# Patient Record
Sex: Male | Born: 1950
Health system: Southern US, Community
[De-identification: ages and names within clinical notes are randomized; demographics above are authoritative.]

## PROBLEM LIST (undated history)

## (undated) DIAGNOSIS — H269 Unspecified cataract: Secondary | ICD-10-CM

## (undated) DIAGNOSIS — R972 Elevated prostate specific antigen [PSA]: Secondary | ICD-10-CM

## (undated) DIAGNOSIS — I10 Essential (primary) hypertension: Secondary | ICD-10-CM

## (undated) DIAGNOSIS — E785 Hyperlipidemia, unspecified: Secondary | ICD-10-CM

## (undated) HISTORY — DX: Elevated prostate specific antigen (PSA): R97.20

## (undated) HISTORY — PX: PROSTATE BIOPSY: SHX241

## (undated) HISTORY — DX: Hyperlipidemia, unspecified: E78.5

## (undated) HISTORY — PX: EYE SURGERY: SHX253

## (undated) HISTORY — DX: Unspecified cataract: H26.9

## (undated) HISTORY — PX: CARDIAC CATHETERIZATION: SHX172

## (undated) HISTORY — PX: NOSE SURGERY: SHX723

---
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
## 2007-04-29 ENCOUNTER — Inpatient Hospital Stay (HOSPITAL_COMMUNITY): Admission: EM | Admit: 2007-04-29 | Discharge: 2007-05-01 | Payer: Self-pay | Admitting: Emergency Medicine

## 2010-09-15 NOTE — Cardiovascular Report (Signed)
NAME:  Beard, Ryan               ACCOUNT NO.:  [id number]   MEDICAL RECORD NO.:  [id number]          PATIENT TYPE:  INP   LOCATION:  3710                         FACILITY:  [facility]   PHYSICIAN:  Nicki Guadalajara, M.D.     DATE OF BIRTH:  [birth year]   DATE OF PROCEDURE:  DATE OF DISCHARGE:                            CARDIAC CATHETERIZATION   [name] is a 60 year old patient who was admitted on Saturday,  April 29, 2007 with chest pain.  He had recently seen Dr. Allyson Sabal and  had a mildly abnormal stress Myoview scan done for evaluation of chest  pain.  He had been scheduled for an outpatient cardiac catheterization  tentatively for May 09, 2007.  He has a history of hyperlipidemia,  remote tobacco use.  With his recent admission with chest pain  definitive cardiac catheterization was recommended now while in the  hospital.   PROCEDURE:  After premedication with Versed 2 mg intravenously, the  patient was prepped and draped in usual fashion.  His right femoral  artery was punctured anteriorly and a 5-French sheath was inserted  without difficulty.  Diagnostic cardiac catheterization was done with 5-  [place] Judkins for left coronary catheter.  For selective angiography in  the right coronary artery a 5-French 3.5 right catheter was necessary  due to the upward takeoff of the vessel.  A 5-French pigtail catheter  was used for biplane selective angiography, as well as distal  aortography.  Hemostasis was obtained by direct manual pressure.  The  patient tolerated the procedure well and returned to his room in  satisfactory condition.   HEMODYNAMIC DATA:  Central aortic pressure is 107/61.  Left ventricular  pressure is 107/90.   ANGIOGRAPHIC DATA:  The left main coronary artery was angiographically  normal and bifurcated into an LAD and left circumflex system.   The LAD gave rise to a proximal diagonal vessel, as well as a smaller  mid second diagonal vessel.  There was  mild to moderate luminal  irregularity of 10-20% narrowings in the first diagonal vessel, as well  as in the LAD beyond the first diagonal vessel.   The circumflex vessel was angiographically normal and gave rise to two  marginal vessels.   The right coronary artery was a large-caliber dominant vessel that had  luminal irregularity of approximately 20% in the proximal mid segment  and also in the region of the crux.  The RCA supplied a large PDA and  PLA vessel.   Biplane selective artery revealed normal LV contractility without focal  segmental wall motion abnormalities.  There was suggestion of mild  angiographic mitral valve prolapse.   Distal aortography did not demonstrate any renal artery stenosis.  There  was no significant aneurysmal dilatation.  He had a normal aortoiliac  system.   IMPRESSION:  1. Normal left ventricular function with suggestion of mild      angiographic mitral valve prolapse.  2. Mild luminal irregularity without significant obstructive stenoses      with 10-20% narrowings involving the proximal to mid left anterior      descending artery,  first diagonal branch of the left anterior      descending artery and right coronary artery.   RECOMMENDATIONS:  Medical therapy.  The patient will be discharged home  later today with plans for follow up with Dr. Allyson Sabal.           ______________________________  Nicki Guadalajara, M.D.     TK/MEDQ  D:  05/01/2007  T:  05/01/2007  Job:  [id number]   cc:   Antonieta Iba, MD  Nanetta Batty, M.D.  Fayrene Fearing Little

## 2010-09-15 NOTE — H&P (Signed)
NAME:  Munos, Hardie               ACCOUNT NO.:  [id number]   MEDICAL RECORD NO.:  [id number]          PATIENT TYPE:  INP   LOCATION:  1832                         FACILITY:  [facility]   PHYSICIAN:  Antonieta Iba, MD   DATE OF BIRTH:  07-Jun-1950   DATE OF ADMISSION:  04/29/2007  DATE OF DISCHARGE:                              HISTORY & PHYSICAL   PRIORITY ADMISSION HISTORY AND PHYSICAL   Mr. Marotto, a 60 year old, Caucasian gentleman, presents to the  emergency room at [facility] with the complaint of chest pain.  He was a new patient in our practice recently seen by Dr. Allyson Sabal and  underwent stress test that revealed abnormal results.  The patient was  referred to our office by Dr. Susann Givens, who noticed that he started  experiencing some chest pain.  He could not exactly describe it, but  said that it starts with specific activity, but he has had it mainly  when he becomes emotionally upset and he experiences tightness in the  head and knows that his blood pressure goes up and at the same time has  the tightness and pain in the mid chest.  At the same time, he has  shortness of breath, but never experiencing any diaphoresis, nausea, or  vomiting.  With these symptoms, he was seen in our office and, as I  mentioned above, underwent a stress test, which revealed abnormal  results.  Hence, he was scheduled for diagnostic coronary angiography on  May 09, 2007, but today called [place] and said that he started having  some more chest pain and he did not have any nitroglycerin prescribed to  him so patient was advised to come to the emergency room for evaluation.   PAST MEDICAL HISTORY:  1. Hypercholesterolemia.  2. Vitamin D deficiency.  3. Testosterone deficiency.   FAMILY HISTORY:  Significant for coronary artery disease.  His brother  and his father both were diagnosed with that.   SOCIAL HISTORY:  He is married, does not have children, he quit smoking  in July of 2008,  he is physically active and plays golf and said that  ironically his chest pain occurs while he plays golf, but never happens  when he exerts himself walking on a treadmill or doing some more  aggressive exercises.   He does not smoke, occasionally drinks alcohol, he works as an  Acupuncturist, and he has his own business.   ALLERGIES:  No known drug allergies.   REVIEW OF SYSTEMS:  As mentioned, patient does complain of chest pain  that occurs sometimes at rest, more so lately, but mainly with emotional  exertion or sometimes with moderate physical activity.  He denies any  musculoskeletal complaints, he does have occasional indigestion  depending on what type of food he eats, he does not have any nausea,  vomiting, diarrhea, constipation, there is no melena, the patient denied  any hematuria or any dysuria, frequency, or urgency with urination.  There is no headache, night sweats, weight loss or gain recently.  All  other review of systems is negative.  MEDICATIONS:  1. Aspirin 81 mg daily.  2. Crestor 5 mg daily.  3. Vitamin D once a week, but he is unsure of the dose of it and his      wife will bring it later.   PHYSICAL EXAMINATION:  VITAL SIGNS:  Blood pressure 160/90, first  reading, and the second reading revealed higher systolic blood pressure  of 166, heart rate 75, his respirations were 20, temperature 98.1, and O  SATs 99% at room air.  HEENT:  Normocephalic, atraumatic, extraocular movements intact.  NECK:  Without any JVD or carotid bruits.  LUNGS:  Clear to auscultation and percussion bilaterally.  HEART:  Regular rate and rhythm, there are no murmurs, gallops, or rubs.  ABDOMEN:  Soft, nontender, and nondistended with positive bowel sounds  x4.  EXTREMITIES:  No signs of edema with intact peripheral pulses,  extremities were intact in pedal pulses.  NEURO:  Grossly intact, no focal abnormality, the patient was alert and  oriented x3.   His EKG showed  sinus rhythm, no acute ST-T wave changes.   LABORATORY DATA:  All are pending.   IMPRESSION:  1. Chest pain.  The patient had abnormal stress test in our office and      was scheduled for coronary angiography on May 09, 2007.  2. Known history of hyperlipidemia.  3. Vitamin D deficiency.  4. Testosterone deficiency.  5. Accelerated blood pressure on admission.   PLAN:  We are going to admit patient on rule out MI protocol, cycle his  enzymes, start him on IV heparin.  IV nitro will be started if patient  experiences another onset of chest pain.  We will also add beta blockers  that would help with anginal symptoms and better blood pressure control,  and continue home meds.  All further recommendations will be provided by  Dr. Mariah Milling.      Raymon Mutton, P.A.      Antonieta Iba, MD  Electronically Signed    MK/MEDQ  D:  04/29/2007  T:  04/29/2007  Job:  [id number]   cc:   [facility]  Nanetta Batty, M.D.

## 2010-09-15 NOTE — Discharge Summary (Signed)
NAME:  Ryan Beard, Ryan Beard               ACCOUNT NO.:  [id number]   MEDICAL RECORD NO.:  [id number]          PATIENT TYPE:  INP   LOCATION:  2899                         FACILITY:  [facility]   PHYSICIAN:  Antonieta Iba, MD   DATE OF BIRTH:  1950-11-30   DATE OF ADMISSION:  04/29/2007  DATE OF DISCHARGE:  05/01/2007                               DISCHARGE SUMMARY   DISCHARGE DIAGNOSES:  1. Chest pain worrisome for unstable angina with known abnormal      outpatient exercise Myoview, mild coronary disease at      catheterization this admission.  2. Dyslipidemia.   HOSPITAL COURSE:  The patient is a 60 year old male followed by Dr.  Aida Puffer.  He was referred to Dr. Mariah Milling on April 25, 2007.  He  had been having some chest pain.  Apparently an outpatient Myoview had  been done previously which showed ischemia in the basilar, anterior, and  anterolateral regions with good LV function.  The patient was to have a  diagnostic cath at [facility] but presented in the interim with  chest pain to [facility].  He was admitted, started on nitrates and heparin  and beta blocker.  MI was ruled out.  Diagnostic catheterization was  done May 01, 2007 which revealed a 20% RCA and 10% LAD with normal  LV function.  His Crestor was increased to 10 mg a day, and we feel he  can be discharged May 01, 2007.  He will follow up with Dr. Mariah Milling  and Dr. Aida Puffer, his primary care doctor.   DISCHARGE MEDICATIONS:  1. Crestor 10 mg a day.  2. Aspirin 81 mg a day.  3. Vitamin D.  4. CoQ10.   LABORATORY DATA:  White count 10.8, hemoglobin 13.1, hematocrit 38.6,  platelets 184.  Sodium 140, potassium 3.6, BUN 14, creatinine 0.8.  LFTs  were normal.  CK-MB and troponins were negative.  TSH is 1.13.  INR 0.9.   Chest x-ray shows no acute disease.   DISPOSITION:  The patient discharged in stable condition and will  followup with Dr. Dossie Arbour and Dr. Aida Puffer.      Abelino Derrick,  P.A.      Antonieta Iba, MD  Electronically Signed    LKK/MEDQ  D:  05/01/2007  T:  05/01/2007  Job:  [id number]   cc:   Aida Puffer

## 2011-02-05 LAB — CBC
HCT: 38.6 — ABNORMAL LOW
HCT: 40.7
HCT: 44.1
HCT: 44.5
Hemoglobin: 15.1
MCV: 87.5
MCV: 88.8
MCV: 88.9
Platelets: 164
Platelets: 184
Platelets: 188
Platelets: 205
RBC: 4.65
RBC: 5
RBC: 5
RDW: 13.2
RDW: 13.4
WBC: 10.8 — ABNORMAL HIGH
WBC: 6.9

## 2011-02-05 LAB — PROTIME-INR
INR: 0.9
INR: 1
Prothrombin Time: 12.9

## 2011-02-05 LAB — CARDIAC PANEL(CRET KIN+CKTOT+MB+TROPI)
CK, MB: 0.9
Total CK: 87
Troponin I: 0.02

## 2011-02-05 LAB — MAGNESIUM: Magnesium: 2.3

## 2011-02-05 LAB — COMPREHENSIVE METABOLIC PANEL
AST: 27
Alkaline Phosphatase: 40
BUN: 14
CO2: 22
Creatinine, Ser: 0.85
Glucose, Bld: 109 — ABNORMAL HIGH

## 2011-02-05 LAB — CK TOTAL AND CKMB (NOT AT ARMC)
CK, MB: 1.7
Total CK: 111

## 2011-02-05 LAB — BASIC METABOLIC PANEL
BUN: 14
Calcium: 9.1
Chloride: 106
Creatinine, Ser: 0.89
Creatinine, Ser: 0.94
GFR calc Af Amer: >60
GFR calc non Af Amer: >60
Glucose, Bld: 104 — ABNORMAL HIGH
Potassium: 3.6
Potassium: 4
Sodium: 140

## 2011-02-05 LAB — I-STAT 8, (EC8 V) (CONVERTED LAB)
Acid-base deficit: 1
HCT: 44
Hemoglobin: 15
Sodium: 138
TCO2: 24
pCO2, Ven: 34 — ABNORMAL LOW

## 2011-02-05 LAB — DIFFERENTIAL
Basophils Relative: 0
Eosinophils Absolute: 0.2
Eosinophils Relative: 3
Lymphs Abs: 1.9
Monocytes Relative: 8

## 2011-02-05 LAB — TSH: TSH: 1.133

## 2011-02-05 LAB — POCT CARDIAC MARKERS
Myoglobin, poc: 74.6
Operator id: 265201
Troponin i, poc: <0.05

## 2011-02-05 LAB — HEPARIN LEVEL (UNFRACTIONATED): Heparin Unfractionated: 0.52

## 2013-02-12 ENCOUNTER — Encounter: Payer: Self-pay | Admitting: Internal Medicine

## 2013-03-06 ENCOUNTER — Encounter: Payer: Self-pay | Admitting: Internal Medicine

## 2013-03-14 ENCOUNTER — Ambulatory Visit (AMBULATORY_SURGERY_CENTER): Payer: Self-pay | Admitting: *Deleted

## 2013-03-14 VITALS — Ht 69.0 in | Wt 186.6 lb

## 2013-03-14 DIAGNOSIS — Z1211 Encounter for screening for malignant neoplasm of colon: Secondary | ICD-10-CM

## 2013-03-14 MED ORDER — MOVIPREP 100 G PO SOLR: ORAL | Status: DC

## 2013-03-14 NOTE — Progress Notes (Signed)
No allergies to eggs or soy. No problems with anesthesia.  

## 2013-03-15 ENCOUNTER — Encounter: Payer: Self-pay | Admitting: Gastroenterology

## 2013-03-28 ENCOUNTER — Ambulatory Visit (AMBULATORY_SURGERY_CENTER): Payer: BC Managed Care – PPO | Admitting: Gastroenterology

## 2013-03-28 ENCOUNTER — Encounter: Payer: Self-pay | Admitting: Gastroenterology

## 2013-03-28 VITALS — BP 146/85 | HR 65 | Temp 97.1°F | Resp 17 | Ht 69.0 in | Wt 186.0 lb

## 2013-03-28 DIAGNOSIS — D126 Benign neoplasm of colon, unspecified: Secondary | ICD-10-CM

## 2013-03-28 DIAGNOSIS — Z1211 Encounter for screening for malignant neoplasm of colon: Secondary | ICD-10-CM

## 2013-03-28 MED ORDER — SODIUM CHLORIDE 0.9 % IV SOLN: 500.0000 mL | INTRAVENOUS | Status: DC

## 2013-03-28 NOTE — Op Note (Signed)
[facility] [address] [place], [address]   COLONOSCOPY PROCEDURE REPORT  PATIENT: Ryan Beard, Ryan Beard  MR#: [id number] BIRTHDATE: 03-30-1951 , 62  yrs. old GENDER: Male ENDOSCOPIST: Mardella Layman, MD, [facility] REFERRED BY: PROCEDURE DATE:  03/28/2013 PROCEDURE:   Colonoscopy with snare polypectomy First Screening Colonoscopy - Avg.  risk and is 50 yrs.  old or older - No.  Prior Negative Screening - Now for repeat screening. N/A  History of Adenoma - Now for follow-up colonoscopy & has been > or = to 3 yrs.  N/A  Polyps Removed Today? Yes. ASA CLASS: INDICATIONS:average risk screening. MEDICATIONS: propofol (Diprivan) 350mg  IV  DESCRIPTION OF PROCEDURE:   After the risks benefits and alternatives of the procedure were thoroughly explained, informed consent was obtained.  A digital rectal exam revealed no abnormalities of the rectum.   The LB [id number]  endoscope was introduced through the anus and advanced to the cecum, which was identified by both the appendix and ileocecal valve. No adverse events experienced.   Limited by a tortuous and redundant colon. The quality of the prep was adequate.  The instrument was then slowly withdrawn as the colon was fully examined.      COLON FINDINGS: A smooth sessile polyp ranging between 3-72mm in size was found at the hepatic flexure.  A polypectomy was performed with a cold snare.  The resection was complete and the polyp tissue was completely retrieved.   The colon was otherwise normal.  There was no diverticulosis, inflammation, polyps or cancers unless previously stated.  Retroflexed views revealed no abnormalities. The time to cecum=3 minutes 18 seconds.  Withdrawal time=12 minutes 10 seconds.  The scope was withdrawn and the procedure completed. COMPLICATIONS: There were no complications.  ENDOSCOPIC IMPRESSION: 1.   Sessile polyp ranging between 3-88mm in size was found at the hepatic flexure;  polypectomy was performed with a cold snare 2.   The colon was otherwise normal  RECOMMENDATIONS: 1.  Repeat colonoscopy in 5 years if polyp adenomatous; otherwise 10 years 2.  Continue current medications   eSigned:  Mardella Layman, MD, [facility] 03/28/2013 10:48 AM   cc:

## 2013-03-28 NOTE — Patient Instructions (Signed)
YOU HAD AN ENDOSCOPIC PROCEDURE TODAY AT THE [facility]: Refer to the procedure report that was given to you for any specific questions about what was found during the examination.  If the procedure report does not answer your questions, please call your gastroenterologist to clarify.  If you requested that your care partner not be given the details of your procedure findings, then the procedure report has been included in a sealed envelope for you to review at your convenience later.  YOU SHOULD EXPECT: Some feelings of bloating in the abdomen. Passage of more gas than usual.  Walking can help get rid of the air that was put into your GI tract during the procedure and reduce the bloating. If you had a lower endoscopy (such as a colonoscopy or flexible sigmoidoscopy) you may notice spotting of blood in your stool or on the toilet paper. If you underwent a bowel prep for your procedure, then you may not have a normal bowel movement for a few days.  DIET: Your first meal following the procedure should be a light meal and then it is ok to progress to your normal diet.  A half-sandwich or bowl of soup is an example of a good first meal.  Heavy or fried foods are harder to digest and may make you feel nauseous or bloated.  Likewise meals heavy in dairy and vegetables can cause extra gas to form and this can also increase the bloating.  Drink plenty of fluids but you should avoid alcoholic beverages for 24 hours.  ACTIVITY: Your care partner should take you home directly after the procedure.  You should plan to take it easy, moving slowly for the rest of the day.  You can resume normal activity the day after the procedure however you should NOT DRIVE or use heavy machinery for 24 hours (because of the sedation medicines used during the test).    SYMPTOMS TO REPORT IMMEDIATELY: A gastroenterologist can be reached at any hour.  During normal business hours, 8:30 AM to 5:00 PM Monday through Friday,  call [phone].  After hours and on weekends, please call the GI answering service at [phone] who will take a message and have the physician on call contact you.   Following lower endoscopy (colonoscopy or flexible sigmoidoscopy):  Excessive amounts of blood in the stool  Significant tenderness or worsening of abdominal pains  Swelling of the abdomen that is new, acute  Fever of 100F or higher  FOLLOW UP: If any biopsies were taken you will be contacted by phone or by letter within the next 1-3 weeks.  Call your gastroenterologist if you have not heard about the biopsies in 3 weeks.  Our staff will call the home number listed on your records the next business day following your procedure to check on you and address any questions or concerns that you may have at that time regarding the information given to you following your procedure. This is a courtesy call and so if there is no answer at the home number and we have not heard from you through the emergency physician on call, we will assume that you have returned to your regular daily activities without incident.  SIGNATURES/CONFIDENTIALITY: You and/or your care partner have signed paperwork which will be entered into your electronic medical record.  These signatures attest to the fact that that the information above on your After Visit Summary has been reviewed and is understood.  Full responsibility of the confidentiality of this   discharge information lies with you and/or your care-partner.  Polyps-handout given  Repeat colonoscopy will be determined by pathology   

## 2013-03-28 NOTE — Progress Notes (Signed)
Called to room to assist during endoscopic procedure.  Patient ID and intended procedure confirmed with present staff. Received instructions for my participation in the procedure from the performing physician.  

## 2013-03-28 NOTE — Progress Notes (Signed)
Patient did not experience any of the following events: a burn prior to discharge; a fall within the facility; wrong site/side/patient/procedure/implant event; or a hospital transfer or hospital admission upon discharge from the facility. (G8907) Patient did not have preoperative order for IV antibiotic SSI prophylaxis. (G8918)  

## 2013-04-02 ENCOUNTER — Telehealth: Payer: Self-pay | Admitting: *Deleted

## 2013-04-02 NOTE — Telephone Encounter (Signed)
  Follow up Call-  Call back number 03/28/2013  Post procedure Call Back phone  # [phone] hm  Permission to leave phone message Yes     Patient questions:  Do you have a fever, pain , or abdominal swelling? no Pain Score  0 *  Have you tolerated food without any problems? yes  Have you been able to return to your normal activities? yes  Do you have any questions about your discharge instructions: Diet   no Medications  no Follow up visit  no  Do you have questions or concerns about your Care? no  Actions: * If pain score is 4 or above: No action needed, pain <4.

## 2013-04-03 ENCOUNTER — Encounter: Payer: Self-pay | Admitting: Gastroenterology

## 2016-08-23 LAB — BASIC METABOLIC PANEL
BUN: 21 (ref 4–21)
Creatinine: 1 (ref <=1.3)
GLUCOSE: 95
Potassium: 4.9 (ref 3.4–5.3)
SODIUM: 139 (ref 137–147)

## 2016-08-23 LAB — LIPID PANEL
Cholesterol: 279 — AB (ref 0–200)
HDL: 44 (ref 35–70)
LDL CALC: 198
Triglycerides: 192 — AB (ref 40–160)

## 2016-08-23 LAB — VITAMIN B12: Vitamin B-12: 759

## 2016-08-23 LAB — HEPATIC FUNCTION PANEL
ALT: 14 (ref 10–40)
AST: 15 (ref 14–40)
Alkaline Phosphatase: 41 (ref 25–125)
Bilirubin, Total: 0.4

## 2016-08-23 LAB — CBC AND DIFFERENTIAL
HCT: 43 (ref 41–53)
Hemoglobin: 14.9 (ref 13.5–17.5)
NEUTROS ABS: 4013
Platelets: 164 (ref 150–399)
WBC: 6.4

## 2016-08-23 LAB — PSA: PSA: 7.7

## 2016-08-23 LAB — TSH: TSH: 2.55 (ref <=5.90)

## 2016-08-23 LAB — HEMOGLOBIN A1C: Hemoglobin A1C: 5.2

## 2016-08-23 LAB — T4, FREE: FREE T4: 1

## 2016-08-23 LAB — TESTOSTERONE TOTAL,FREE,BIO, MALES: Testosterone: 158

## 2017-02-21 ENCOUNTER — Other Ambulatory Visit (HOSPITAL_COMMUNITY): Payer: Self-pay | Admitting: Urology

## 2017-02-21 DIAGNOSIS — R972 Elevated prostate specific antigen [PSA]: Secondary | ICD-10-CM

## 2017-03-03 ENCOUNTER — Ambulatory Visit (HOSPITAL_COMMUNITY)
Admission: RE | Admit: 2017-03-03 | Discharge: 2017-03-03 | Disposition: A | Discharge status: Home / Self Care | Payer: BLUE CROSS/BLUE SHIELD | Source: Ambulatory Visit | Attending: Urology | Admitting: Urology

## 2017-03-03 DIAGNOSIS — N400 Enlarged prostate without lower urinary tract symptoms: Secondary | ICD-10-CM | POA: Insufficient documentation

## 2017-03-03 DIAGNOSIS — R972 Elevated prostate specific antigen [PSA]: Secondary | ICD-10-CM

## 2017-03-03 LAB — POCT I-STAT CREATININE: CREATININE: 0.9 mg/dL (ref 0.61–1.24)

## 2017-03-03 MED ORDER — GADOBENATE DIMEGLUMINE 529 MG/ML IV SOLN
20.0000 mL | Freq: Once | INTRAVENOUS | Status: AC | PRN
  Administered 2017-03-03: 17 mL via INTRAVENOUS

## 2017-03-03 MED ORDER — LIDOCAINE HCL 2 % EX GEL
CUTANEOUS | Status: AC
  Filled 2017-03-03: qty 30

## 2017-03-07 MED ORDER — GADOBENATE DIMEGLUMINE 529 MG/ML IV SOLN
20.0000 mL | Freq: Once | INTRAVENOUS | Status: AC | PRN
  Administered 2017-03-07: 17 mL via INTRAVENOUS

## 2017-08-03 DIAGNOSIS — E291 Testicular hypofunction: Secondary | ICD-10-CM | POA: Diagnosis not present

## 2017-08-03 DIAGNOSIS — R972 Elevated prostate specific antigen [PSA]: Secondary | ICD-10-CM | POA: Diagnosis not present

## 2017-08-15 ENCOUNTER — Encounter: Payer: Self-pay | Admitting: Family Medicine

## 2017-08-15 ENCOUNTER — Ambulatory Visit: Payer: Self-pay | Admitting: Family Medicine

## 2017-08-15 VITALS — BP 159/104 | HR 102 | Ht 69.0 in | Wt 179.4 lb

## 2017-08-15 NOTE — Progress Notes (Signed)
Subjective:    Patient ID: Ryan Beard, male    DOB: Sep 26, 1950, 67 y.o.   MRN: [id number]  HPI:  Mr. Ryan Beard is here to establish as a new pt.  He is a pleasant 67 year old male. PMH:  HLD, abnormal PSA, low testosterone, elevated BP, and CP that required full cardiac work-up in Dec 2008.  Per pt " I was told I had mild ischemia" and he has not followed back up with cards since then. Urology treats his low testosterone and elevated PSA.   He currently denies any acute complaints.   He uses P90X workout regime 3-4 times/week, and golfs twice a week. He drinks "water all day" and eats a diet high in Na++. 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 Abnormal PSA 08/17/2017  . Hyperlipidemia 08/17/2017  . Transient elevated blood pressure 08/17/2017  . Healthcare maintenance 08/17/2017  . Family history of diabetes mellitus in sister 08/17/2017     Past Medical History:  Diagnosis Date  . Abnormal PSA   . Hyperlipidemia      Past Surgical History:  Procedure Laterality Date  . APPENDECTOMY  1973  . CARDIAC CATHETERIZATION    . PROSTATE BIOPSY    . SEPTOPLASTY  1960  . TONSILLECTOMY  1960     Family History  Problem Relation Age of Onset  . Heart disease Mother   . Cancer Father        lymphoma  . Hypertension Father   . Alcohol abuse Sister   . Colon cancer Neg Hx   . Esophageal cancer Neg Hx   . Rectal cancer Neg Hx   . Stomach cancer Neg Hx   . Prostate cancer Neg Hx      Social History   Substance and Sexual Activity  Drug Use No     Social History   Substance and Sexual Activity  Alcohol Use Yes  . Alcohol/week: 0.6 oz  . Types: 1 Glasses of wine per week     Social History    Tobacco Use  Smoking Status Former Smoker  . Packs/day: 1.00  . Years: 30.00  . Pack years: 30.00  . Last attempt to quit: 05/03/2005  . Years since quitting: 12.2  Smokeless Tobacco Never Used     Outpatient Encounter Medications as of 08/17/2017  Medication Sig  . Acetylcysteine (N-ACETYL-L-CYSTEINE PO) Take 600 mg by mouth daily.  Marland Kitchen aspirin 81 MG tablet Take 81 mg by mouth daily.  Marland Kitchen BLACK PEPPER-TURMERIC PO Take 600 mg by mouth daily.  . CHELATED MAGNESIUM PO Take 1 tablet by mouth daily.  . Cholecalciferol (VITAMIN D-3) 5000 UNITS TABS Take by mouth daily.  . Cyanocobalamin (B-12) 2500 MCG TABS Take by mouth daily.  Ryan Beard Oil 500 MG CAPS Take 1 capsule by mouth daily.  Marland Kitchen L-Arginine 500 MG TABS Take 1 tablet by mouth daily.  . Misc Natural Products (BETA-SITOSTEROL PLANT STEROLS) CAPS Take 160 mg by mouth daily.  . Prasterone, DHEA, (DHEA 50 PO) Take 1 tablet by mouth daily.  . Pregnenolone POWD 75 mg by Does not apply route daily.  Marland Kitchen Resveratrol 250 MG CAPS Take by mouth daily.  Marland Kitchen  rosuvastatin (CRESTOR) 5 MG tablet Take 1 tablet (5 mg total) by mouth daily.  . saw palmetto 160 MG capsule Take 160 mg by mouth 2 (two) times daily.  . tadalafil (CIALIS) 5 MG tablet Take 5 mg by mouth daily as needed for erectile dysfunction.  Marland Kitchen UNABLE TO FIND daily. Med Name: Compounded Testosterone gel 6.5%  . UNABLE TO FIND daily. Med Name: ZMA b6 magnesium and zinc  . UNABLE TO FIND Take 1 tablet by mouth daily. Med Name: Enhanced PQQ with 50 mg Ubiquinol  . UNABLE TO FIND Take 150 mg by mouth daily. Med Name: K2 MK7  . UNABLE TO FIND Med Name: Olipure BP  . UNABLE TO FIND Take 12.5 mg by mouth daily. Med Name: I-Throid Iodine  . vitamin C (ASCORBIC ACID) 500 MG tablet Take 500 mg by mouth daily.  . vitamin E (VITAMIN E) 400 UNIT capsule Take 400 Units by mouth daily.  . [DISCONTINUED] rosuvastatin (CRESTOR) 5 MG tablet Take 5 mg by mouth daily.   No facility-administered encounter  medications on file as of 08/17/2017.     Allergies: Patient has no known allergies.  Body mass index is 26.43 kg/m.  Blood pressure (!) 165/77, pulse 76, height 5\' 9"  (1.753 m), weight 178 lb 15.4 oz (81.2 kg), SpO2 98 %.  Review of Systems  Constitutional: Positive for fatigue. Negative for activity change, appetite change, chills, diaphoresis, fever and unexpected weight change.  Eyes: Negative for visual disturbance.  Respiratory: Negative for cough, chest tightness, shortness of breath, wheezing and stridor.   Cardiovascular: Negative for chest pain, palpitations and leg swelling.  Gastrointestinal: Negative for abdominal distention, abdominal pain, blood in stool, constipation, diarrhea, nausea, rectal pain and vomiting.  Genitourinary: Negative for difficulty urinating, flank pain and hematuria.  Neurological: Negative for dizziness and headaches.  Hematological: Does not bruise/bleed easily.  Psychiatric/Behavioral: Negative for confusion, decreased concentration, dysphoric mood, hallucinations, self-injury, sleep disturbance and suicidal ideas. The patient is not nervous/anxious and is not hyperactive.        Objective:   Physical Exam  Constitutional: He is oriented to person, place, and time. He appears well-developed and well-nourished. No distress.  HENT:  Head: Normocephalic and atraumatic.  Right Ear: External ear normal.  Left Ear: External ear normal.  Eyes: Pupils are equal, round, and reactive to light. Conjunctivae are normal.  Cardiovascular: Normal rate, regular rhythm, normal heart sounds and intact distal pulses.  No murmur heard. Pulmonary/Chest: Effort normal and breath sounds normal. No respiratory distress. He has no wheezes. He has no rales. He exhibits no tenderness.  Neurological: He is alert and oriented to person, place, and time.  Skin: Skin is warm and dry. No rash noted. He is not diaphoretic. No erythema. No pallor.  Psychiatric: He has a  normal mood and affect. His behavior is normal. Judgment and thought content normal.  Nursing note and vitals reviewed.     Assessment & Plan:   1. Transient elevated blood pressure   2. Healthcare maintenance   3. Family history of diabetes mellitus in sister   49. Hyperlipidemia, unspecified hyperlipidemia type   5. Abnormal PSA     Healthcare maintenance Continue all current medications as directed. Continue with Urologist as directed. Follow-up in 3 months for complete physical with fasting labs.  Transient elevated blood pressure Reduce sodium intake and continue regular exercise.  If blood pressure is still above goal, will discuss starting an anti-hypertensive.  Hyperlipidemia Currently on Rosuvastatin 5mg  QD Follow  heart healthy diet Continue regular exercise  Abnormal PSA Followed by Urology Q6M    FOLLOW-UP:  Return in about 3 months (around 11/16/2017) for CPE, Fasting Labs.

## 2017-08-17 ENCOUNTER — Ambulatory Visit (INDEPENDENT_AMBULATORY_CARE_PROVIDER_SITE_OTHER): Payer: PPO | Admitting: Adult Health

## 2017-08-17 ENCOUNTER — Encounter: Payer: Self-pay | Admitting: Adult Health

## 2017-08-17 VITALS — BP 165/77 | HR 76 | Ht 69.0 in | Wt 179.0 lb

## 2017-08-17 DIAGNOSIS — R972 Elevated prostate specific antigen [PSA]: Secondary | ICD-10-CM | POA: Diagnosis not present

## 2017-08-17 DIAGNOSIS — Z833 Family history of diabetes mellitus: Secondary | ICD-10-CM | POA: Diagnosis not present

## 2017-08-17 DIAGNOSIS — R030 Elevated blood-pressure reading, without diagnosis of hypertension: Secondary | ICD-10-CM | POA: Diagnosis not present

## 2017-08-17 DIAGNOSIS — Z0000 Encounter for general adult medical examination without abnormal findings: Secondary | ICD-10-CM | POA: Diagnosis not present

## 2017-08-17 DIAGNOSIS — E785 Hyperlipidemia, unspecified: Secondary | ICD-10-CM | POA: Insufficient documentation

## 2017-08-17 DIAGNOSIS — I10 Essential (primary) hypertension: Secondary | ICD-10-CM | POA: Insufficient documentation

## 2017-08-17 MED ORDER — ROSUVASTATIN CALCIUM 5 MG PO TABS: 5.0000 mg | ORAL_TABLET | Freq: Every day | ORAL | 2 refills | Status: DC

## 2017-08-17 NOTE — Assessment & Plan Note (Signed)
Continue all current medications as directed. Continue with Urologist as directed. Follow-up in 3 months for complete physical with fasting labs.

## 2017-08-17 NOTE — Assessment & Plan Note (Signed)
>>  ASSESSMENT AND PLAN FOR TRANSIENT ELEVATED BLOOD PRESSURE WRITTEN ON 08/17/2017  2:48 PM BY DANFORD, KATY D, NP  Reduce sodium intake and continue regular exercise.  If blood pressure is still above goal, will discuss starting an anti-hypertensive.

## 2017-08-17 NOTE — Assessment & Plan Note (Signed)
Reduce sodium intake and continue regular exercise.  If blood pressure is still above goal, will discuss starting an anti-hypertensive.

## 2017-08-17 NOTE — Assessment & Plan Note (Signed)
Followed by Urology Q6M 

## 2017-08-17 NOTE — Patient Instructions (Signed)
Mediterranean Diet A Mediterranean diet refers to food and lifestyle choices that are based on the traditions of countries located on the [place]. This way of eating has been shown to help prevent certain conditions and improve outcomes for people who have chronic diseases, like kidney disease and heart disease. What are tips for following this plan? Lifestyle  Cook and eat meals together with your family, when possible.  Drink enough fluid to keep your urine clear or pale yellow.  Be physically active every day. This includes: ? Aerobic exercise like running or swimming. ? Leisure activities like gardening, walking, or housework.  Get 7-8 hours of sleep each night.  If recommended by your health care provider, drink red wine in moderation. This means 1 glass a day for nonpregnant women and 2 glasses a day for men. A glass of wine equals 5 oz (150 mL). Reading food labels  Check the serving size of packaged foods. For foods such as rice and pasta, the serving size refers to the amount of cooked product, not dry.  Check the total fat in packaged foods. Avoid foods that have saturated fat or trans fats.  Check the ingredients list for added sugars, such as corn syrup. Shopping  At the grocery store, buy most of your food from the areas near the walls of the store. This includes: ? Fresh fruits and vegetables (produce). ? Grains, beans, nuts, and seeds. Some of these may be available in unpackaged forms or large amounts (in bulk). ? Fresh seafood. ? Poultry and eggs. ? Low-fat dairy products.  Buy whole ingredients instead of prepackaged foods.  Buy fresh fruits and vegetables in-season from local farmers markets.  Buy frozen fruits and vegetables in resealable bags.  If you do not have access to quality fresh seafood, buy precooked frozen shrimp or canned fish, such as tuna, salmon, or sardines.  Buy small amounts of raw or cooked vegetables, salads, or olives from the  deli or salad bar at your store.  Stock your pantry so you always have certain foods on hand, such as olive oil, canned tuna, canned tomatoes, rice, pasta, and beans. Cooking  Cook foods with extra-virgin olive oil instead of using butter or other vegetable oils.  Have meat as a side dish, and have vegetables or grains as your main dish. This means having meat in small portions or adding small amounts of meat to foods like pasta or stew.  Use beans or vegetables instead of meat in common dishes like chili or lasagna.  Experiment with different cooking methods. Try roasting or broiling vegetables instead of steaming or sauteing them.  Add frozen vegetables to soups, stews, pasta, or rice.  Add nuts or seeds for added healthy fat at each meal. You can add these to yogurt, salads, or vegetable dishes.  Marinate fish or vegetables using olive oil, lemon juice, garlic, and fresh herbs. Meal planning  Plan to eat 1 vegetarian meal one day each week. Try to work up to 2 vegetarian meals, if possible.  Eat seafood 2 or more times a week.  Have healthy snacks readily available, such as: ? Vegetable sticks with hummus. ? Greek yogurt. ? Fruit and nut trail mix.  Eat balanced meals throughout the week. This includes: ? Fruit: 2-3 servings a day ? Vegetables: 4-5 servings a day ? Low-fat dairy: 2 servings a day ? Fish, poultry, or lean meat: 1 serving a day ? Beans and legumes: 2 or more servings a week ? Nuts   and seeds: 1-2 servings a day ? Whole grains: 6-8 servings a day ? Extra-virgin olive oil: 3-4 servings a day  Limit red meat and sweets to only a few servings a month What are my food choices?  Mediterranean diet ? Recommended ? Grains: Whole-grain pasta. Brown rice. Bulgar wheat. Polenta. Couscous. Whole-wheat bread. [place]. ? Vegetables: Artichokes. Beets. Broccoli. Cabbage. Carrots. Eggplant. Green beans. Chard. Kale. Spinach. Onions. Leeks. Peas. Squash.  Tomatoes. Peppers. Radishes. ? Fruits: Apples. Apricots. Avocado. Berries. Bananas. Cherries. Dates. Figs. Grapes. Lemons. Melon. Oranges. Peaches. Plums. Pomegranate. ? Meats and other protein foods: Beans. Almonds. Sunflower seeds. Pine nuts. Peanuts. [place]. Salmon. Scallops. Shrimp. [place]. Tilapia. Clams. Oysters. Eggs. ? Dairy: Low-fat milk. Cheese. Greek yogurt. ? Beverages: Water. Red wine. Herbal tea. ? Fats and oils: Extra virgin olive oil. Avocado oil. Grape seed oil. ? Sweets and desserts: [place] yogurt with honey. Baked apples. Poached pears. Trail mix. ? Seasoning and other foods: Basil. Cilantro. Coriander. Cumin. Mint. Parsley. Sage. Rosemary. Tarragon. Garlic. Oregano. Thyme. Pepper. Balsalmic vinegar. Tahini. Hummus. Tomato sauce. Olives. Mushrooms. ? Limit these ? Grains: Prepackaged pasta or rice dishes. Prepackaged cereal with added sugar. ? Vegetables: Deep fried potatoes (french fries). ? Fruits: Fruit canned in syrup. ? Meats and other protein foods: Beef. Pork. Lamb. Poultry with skin. Hot dogs. [place] Salines. ? Dairy: Ice cream. Sour cream. Whole milk. ? Beverages: Juice. Sugar-sweetened soft drinks. Beer. Liquor and spirits. ? Fats and oils: Butter. Canola oil. Vegetable oil. Beef fat (tallow). Lard. ? Sweets and desserts: Cookies. Cakes. Pies. Candy. ? Seasoning and other foods: Mayonnaise. Premade sauces and marinades. ? The items listed may not be a complete list. Talk with your dietitian about what dietary choices are right for you. Summary  The Mediterranean diet includes both food and lifestyle choices.  Eat a variety of fresh fruits and vegetables, beans, nuts, seeds, and whole grains.  Limit the amount of red meat and sweets that you eat.  Talk with your health care provider about whether it is safe for you to drink red wine in moderation. This means 1 glass a day for nonpregnant women and 2 glasses a day for men. A glass of wine equals 5 oz (150 mL). This information  is not intended to replace advice given to you by your health care provider. Make sure you discuss any questions you have with your health care provider. Document Released: 12/11/2015 Document Revised: 01/13/2016 Document Reviewed: 12/11/2015 Elsevier Interactive Patient Education  2018 [employer].   Hypertension Hypertension, commonly called high blood pressure, is when the force of blood pumping through the arteries is too strong. The arteries are the blood vessels that carry blood from the heart throughout the body. Hypertension forces the heart to work harder to pump blood and may cause arteries to become narrow or stiff. Having untreated or uncontrolled hypertension can cause heart attacks, strokes, kidney disease, and other problems. A blood pressure reading consists of a higher number over a lower number. Ideally, your blood pressure should be below 120/80. The first ("top") number is called the systolic pressure. It is a measure of the pressure in your arteries as your heart beats. The second ("bottom") number is called the diastolic pressure. It is a measure of the pressure in your arteries as the heart relaxes. What are the causes? The cause of this condition is not known. What increases the risk? Some risk factors for high blood pressure are under your control. Others are not. Factors you can  change  Smoking.  Having type 2 diabetes mellitus, high cholesterol, or both.  Not getting enough exercise or physical activity.  Being overweight.  Having too much fat, sugar, calories, or salt (sodium) in your diet.  Drinking too much alcohol. Factors that are difficult or impossible to change  Having chronic kidney disease.  Having a family history of high blood pressure.  Age. Risk increases with age.  Race. You may be at higher risk if you are African-American.  Gender. Men are at higher risk than women before age 70. After age 39, women are at higher risk than  men.  Having obstructive sleep apnea.  Stress. What are the signs or symptoms? Extremely high blood pressure (hypertensive crisis) may cause:  Headache.  Anxiety.  Shortness of breath.  Nosebleed.  Nausea and vomiting.  Severe chest pain.  Jerky movements you cannot control (seizures).  How is this diagnosed? This condition is diagnosed by measuring your blood pressure while you are seated, with your arm resting on a surface. The cuff of the blood pressure monitor will be placed directly against the skin of your upper arm at the level of your heart. It should be measured at least twice using the same arm. Certain conditions can cause a difference in blood pressure between your right and left arms. Certain factors can cause blood pressure readings to be lower or higher than normal (elevated) for a short period of time:  When your blood pressure is higher when you are in a health care provider's office than when you are at home, this is called white coat hypertension. Most people with this condition do not need medicines.  When your blood pressure is higher at home than when you are in a health care provider's office, this is called masked hypertension. Most people with this condition may need medicines to control blood pressure.  If you have a high blood pressure reading during one visit or you have normal blood pressure with other risk factors:  You may be asked to return on a different day to have your blood pressure checked again.  You may be asked to monitor your blood pressure at home for 1 week or longer.  If you are diagnosed with hypertension, you may have other blood or imaging tests to help your health care provider understand your overall risk for other conditions. How is this treated? This condition is treated by making healthy lifestyle changes, such as eating healthy foods, exercising more, and reducing your alcohol intake. Your health care provider may prescribe  medicine if lifestyle changes are not enough to get your blood pressure under control, and if:  Your systolic blood pressure is above 130.  Your diastolic blood pressure is above 80.  Your personal target blood pressure may vary depending on your medical conditions, your age, and other factors. Follow these instructions at home: Eating and drinking  Eat a diet that is high in fiber and potassium, and low in sodium, added sugar, and fat. An example eating plan is called the DASH (Dietary Approaches to Stop Hypertension) diet. To eat this way: ? Eat plenty of fresh fruits and vegetables. Try to fill half of your plate at each meal with fruits and vegetables. ? Eat whole grains, such as whole wheat pasta, brown rice, or whole grain bread. Fill about one quarter of your plate with whole grains. ? Eat or drink low-fat dairy products, such as skim milk or low-fat yogurt. ? Avoid fatty cuts of meat, processed  or cured meats, and poultry with skin. Fill about one quarter of your plate with lean proteins, such as fish, chicken without skin, beans, eggs, and tofu. ? Avoid premade and processed foods. These tend to be higher in sodium, added sugar, and fat.  Reduce your daily sodium intake. Most people with hypertension should eat less than 1,500 mg of sodium a day.  Limit alcohol intake to no more than 1 drink a day for nonpregnant women and 2 drinks a day for men. One drink equals 12 oz of beer, 5 oz of wine, or 1 oz of hard liquor. Lifestyle  Work with your health care provider to maintain a healthy body weight or to lose weight. Ask what an ideal weight is for you.  Get at least 30 minutes of exercise that causes your heart to beat faster (aerobic exercise) most days of the week. Activities may include walking, swimming, or biking.  Include exercise to strengthen your muscles (resistance exercise), such as pilates or lifting weights, as part of your weekly exercise routine. Try to do these types  of exercises for 30 minutes at least 3 days a week.  Do not use any products that contain nicotine or tobacco, such as cigarettes and e-cigarettes. If you need help quitting, ask your health care provider.  Monitor your blood pressure at home as told by your health care provider.  Keep all follow-up visits as told by your health care provider. This is important. Medicines  Take over-the-counter and prescription medicines only as told by your health care provider. Follow directions carefully. Blood pressure medicines must be taken as prescribed.  Do not skip doses of blood pressure medicine. Doing this puts you at risk for problems and can make the medicine less effective.  Ask your health care provider about side effects or reactions to medicines that you should watch for. Contact a health care provider if:  You think you are having a reaction to a medicine you are taking.  You have headaches that keep coming back (recurring).  You feel dizzy.  You have swelling in your ankles.  You have trouble with your vision. Get help right away if:  You develop a severe headache or confusion.  You have unusual weakness or numbness.  You feel faint.  You have severe pain in your chest or abdomen.  You vomit repeatedly.  You have trouble breathing. Summary  Hypertension is when the force of blood pumping through your arteries is too strong. If this condition is not controlled, it may put you at risk for serious complications.  Your personal target blood pressure may vary depending on your medical conditions, your age, and other factors. For most people, a normal blood pressure is less than 120/80.  Hypertension is treated with lifestyle changes, medicines, or a combination of both. Lifestyle changes include weight loss, eating a healthy, low-sodium diet, exercising more, and limiting alcohol. This information is not intended to replace advice given to you by your health care provider.  Make sure you discuss any questions you have with your health care provider. Document Released: 04/19/2005 Document Revised: 03/17/2016 Document Reviewed: 03/17/2016 Elsevier Interactive Patient Education  2018 [employer].  Reduce sodium intake and continue regular exercise.  If blood pressure is still above goal, will discuss starting an anti-hypertensive. Continue all current medications as directed. Continue with Urologist as directed. Follow-up in 3 months for complete physical with fasting labs. WELCOME TO THE PRACTICE!

## 2017-08-17 NOTE — Assessment & Plan Note (Signed)
Currently on Rosuvastatin 5mg  QD Follow heart healthy diet Continue regular exercise

## 2017-11-09 ENCOUNTER — Other Ambulatory Visit (INDEPENDENT_AMBULATORY_CARE_PROVIDER_SITE_OTHER): Payer: PPO

## 2017-11-09 DIAGNOSIS — R030 Elevated blood-pressure reading, without diagnosis of hypertension: Secondary | ICD-10-CM | POA: Diagnosis not present

## 2017-11-09 DIAGNOSIS — Z833 Family history of diabetes mellitus: Secondary | ICD-10-CM | POA: Diagnosis not present

## 2017-11-09 DIAGNOSIS — Z0000 Encounter for general adult medical examination without abnormal findings: Secondary | ICD-10-CM

## 2017-11-10 LAB — COMPREHENSIVE METABOLIC PANEL
ALK PHOS: 40 IU/L (ref 39–117)
ALT: 21 IU/L (ref 0–44)
AST: 15 IU/L (ref 0–40)
Albumin/Globulin Ratio: 2.3 — ABNORMAL HIGH (ref 1.2–2.2)
Albumin: 4.6 g/dL (ref 3.6–4.8)
BUN/Creatinine Ratio: 15 (ref 10–24)
BUN: 17 mg/dL (ref 8–27)
Bilirubin Total: 0.5 mg/dL (ref 0.0–1.2)
CHLORIDE: 106 mmol/L (ref 96–106)
CO2: 22 mmol/L (ref 20–29)
CREATININE: 1.15 mg/dL (ref 0.76–1.27)
Calcium: 9.8 mg/dL (ref 8.6–10.2)
GFR calc Af Amer: 77 mL/min/{1.73_m2} (ref >59)
GFR calc non Af Amer: 66 mL/min/{1.73_m2} (ref >59)
GLOBULIN, TOTAL: 2 g/dL (ref 1.5–4.5)
Glucose: 100 mg/dL — ABNORMAL HIGH (ref 65–99)
POTASSIUM: 5.6 mmol/L — AB (ref 3.5–5.2)
SODIUM: 144 mmol/L (ref 134–144)
Total Protein: 6.6 g/dL (ref 6.0–8.5)

## 2017-11-10 LAB — CBC WITH DIFFERENTIAL/PLATELET
BASOS ABS: 0 10*3/uL (ref 0.0–0.2)
Basos: 0 %
EOS (ABSOLUTE): 0.3 10*3/uL (ref 0.0–0.4)
Eos: 4 %
Hematocrit: 45.5 % (ref 37.5–51.0)
Hemoglobin: 16.1 g/dL (ref 13.0–17.7)
IMMATURE GRANULOCYTES: 0 %
Immature Grans (Abs): 0 10*3/uL (ref 0.0–0.1)
Lymphocytes Absolute: 2 10*3/uL (ref 0.7–3.1)
Lymphs: 28 %
MCH: 30.4 pg (ref 26.6–33.0)
MCHC: 35.4 g/dL (ref 31.5–35.7)
MCV: 86 fL (ref 79–97)
MONOS ABS: 0.5 10*3/uL (ref 0.1–0.9)
Monocytes: 7 %
NEUTROS PCT: 61 %
Neutrophils Absolute: 4.2 10*3/uL (ref 1.4–7.0)
PLATELETS: 159 10*3/uL (ref 150–450)
RBC: 5.3 x10E6/uL (ref 4.14–5.80)
RDW: 13.5 % (ref 12.3–15.4)
WBC: 7 10*3/uL (ref 3.4–10.8)

## 2017-11-10 LAB — HEMOGLOBIN A1C
Est. average glucose Bld gHb Est-mCnc: 108 mg/dL
HEMOGLOBIN A1C: 5.4 % (ref 4.8–5.6)

## 2017-11-10 LAB — LIPID PANEL
CHOL/HDL RATIO: 3.9 ratio (ref 0.0–5.0)
CHOLESTEROL TOTAL: 184 mg/dL (ref 100–199)
HDL: 47 mg/dL (ref >39)
LDL Calculated: 109 mg/dL — ABNORMAL HIGH (ref 0–99)
Triglycerides: 140 mg/dL (ref 0–149)
VLDL CHOLESTEROL CAL: 28 mg/dL (ref 5–40)

## 2017-11-10 LAB — TSH: TSH: 2.44 u[IU]/mL (ref 0.450–4.500)

## 2017-11-16 ENCOUNTER — Ambulatory Visit (INDEPENDENT_AMBULATORY_CARE_PROVIDER_SITE_OTHER): Payer: PPO | Admitting: Adult Health

## 2017-11-16 ENCOUNTER — Encounter: Payer: Self-pay | Admitting: Adult Health

## 2017-11-16 VITALS — BP 161/65 | HR 88 | Ht 69.0 in | Wt 181.7 lb

## 2017-11-16 DIAGNOSIS — Z9189 Other specified personal risk factors, not elsewhere classified: Secondary | ICD-10-CM

## 2017-11-16 DIAGNOSIS — Z136 Encounter for screening for cardiovascular disorders: Secondary | ICD-10-CM

## 2017-11-16 DIAGNOSIS — Z1159 Encounter for screening for other viral diseases: Secondary | ICD-10-CM

## 2017-11-16 DIAGNOSIS — Z0000 Encounter for general adult medical examination without abnormal findings: Secondary | ICD-10-CM | POA: Diagnosis not present

## 2017-11-16 DIAGNOSIS — Z122 Encounter for screening for malignant neoplasm of respiratory organs: Secondary | ICD-10-CM

## 2017-11-16 MED ORDER — LISINOPRIL 5 MG PO TABS: 5.0000 mg | ORAL_TABLET | Freq: Every day | ORAL | 3 refills | Status: DC

## 2017-11-16 NOTE — Patient Instructions (Addendum)
  Ryan Beard , Thank you for taking time to come for your Medicare Wellness Visit. I appreciate your ongoing commitment to your health goals. Please review the following plan we discussed and let me know if I can assist you in the future.   EKG- NSR with nonspecific ST abnormality-your baseline These are the goals we discussed:  Start Lisinopril 5mg  once daily Continue all other medications as directed Increase plain water intake Continue your healthy eating and regular exercise Imaging ordered Follow-up in 4 weeks, blood pressure check Overall you are doing GREAT! NICE TO SEE YOU!!!  This is a list of the screening recommended for you and due dates:  Health Maintenance  Topic Date Due  . Pneumonia vaccines (1 of 2 - PCV13) 08/06/2017  . Tetanus Vaccine  08/16/2018*  .  Hepatitis C: One time screening is recommended by Center for Disease Control  (CDC) for  adults born from [birth year] through 1963.   08/16/2018*  . HIV Screening  08/16/2018*  . Flu Shot  12/01/2017  . Colon Cancer Screening  03/28/2018  *Topic was postponed. The date shown is not the original due date.

## 2017-11-16 NOTE — Progress Notes (Signed)
Subjective:   Ryan Beard is a 67 y.o. male who presents for a Welcome to Medicare exam.  He reports medication compliance, denies SE He denies acute complaints Reviewed all recent lab results  Review of Systems: General:   Denies fever, chills, unexplained weight loss.  Optho/Auditory:   Denies visual changes, blurred vision/LOV Respiratory:   Denies SOB, DOE more than baseline levels.  Cardiovascular:   Denies chest pain, palpitations, new onset peripheral edema  Gastrointestinal:   Denies nausea, vomiting, diarrhea.  Genitourinary: Denies dysuria, freq/ urgency, flank pain or discharge from genitals.  Endocrine:     Denies hot or cold intolerance, polyuria, polydipsia. Musculoskeletal:   Denies unexplained myalgias, joint swelling, unexplained arthralgias, gait problems.  Skin:  Denies rash, suspicious lesions Neurological:     Denies dizziness, unexplained weakness, numbness  Psychiatric/Behavioral:   Denies mood changes, suicidal or homicidal ideations, hallucinations   Objective:    Today's Vitals   11/16/17 1328  BP: (!) 161/65  Pulse: 88  SpO2: 96%  Weight: 181 lb 11.2 oz (82.4 kg)  Height: 5\' 9"  (1.753 m)   Body mass index is 26.83 kg/m.  Medications Outpatient Encounter Medications as of 11/16/2017  Medication Sig  . Acetylcysteine (N-ACETYL-L-CYSTEINE PO) Take 600 mg by mouth daily.  Marland Kitchen aspirin 81 MG tablet Take 81 mg by mouth daily.  Marland Kitchen BLACK PEPPER-TURMERIC PO Take 600 mg by mouth daily.  . CHELATED MAGNESIUM PO Take 1 tablet by mouth daily.  . Cholecalciferol (VITAMIN D-3) 5000 UNITS TABS Take by mouth daily.  . Cyanocobalamin (B-12) 2500 MCG TABS Take by mouth daily.  Javier Docker Oil 500 MG CAPS Take 1 capsule by mouth daily.  Marland Kitchen L-Arginine 500 MG TABS Take 1 tablet by mouth daily.  . Misc Natural Products (BETA-SITOSTEROL PLANT STEROLS) CAPS Take 160 mg by mouth daily.  . Prasterone, DHEA, (DHEA 50 PO) Take 1 tablet by mouth daily.  . Pregnenolone POWD  75 mg by Does not apply route daily.  Marland Kitchen Resveratrol 250 MG CAPS Take by mouth daily.  . rosuvastatin (CRESTOR) 5 MG tablet Take 1 tablet (5 mg total) by mouth daily.  . saw palmetto 160 MG capsule Take 160 mg by mouth 2 (two) times daily.  . tadalafil (CIALIS) 5 MG tablet Take 5 mg by mouth daily as needed for erectile dysfunction.  Marland Kitchen UNABLE TO FIND daily. Med Name: Compounded Testosterone gel 6.5%  . UNABLE TO FIND daily. Med Name: ZMA b6 magnesium and zinc  . UNABLE TO FIND Take 1 tablet by mouth daily. Med Name: Enhanced PQQ with 50 mg Ubiquinol  . UNABLE TO FIND Take 150 mg by mouth daily. Med Name: K2 MK7  . UNABLE TO FIND Med Name: Olipure BP  . UNABLE TO FIND Take 12.5 mg by mouth daily. Med Name: I-Throid Iodine  . vitamin C (ASCORBIC ACID) 500 MG tablet Take 500 mg by mouth daily.  . vitamin E (VITAMIN E) 400 UNIT capsule Take 400 Units by mouth daily.  Marland Kitchen lisinopril (PRINIVIL,ZESTRIL) 5 MG tablet Take 1 tablet (5 mg total) by mouth daily.   No facility-administered encounter medications on file as of 11/16/2017.      History: Past Medical History:  Diagnosis Date  . Abnormal PSA   . Hyperlipidemia    Past Surgical History:  Procedure Laterality Date  . APPENDECTOMY  1973  . CARDIAC CATHETERIZATION    . PROSTATE BIOPSY    . SEPTOPLASTY  1960  . TONSILLECTOMY  1960    Family History  Problem Relation Age of Onset  . Heart disease Mother   . Cancer Father        lymphoma  . Hypertension Father   . Alcohol abuse Sister   . Colon cancer Neg Hx   . Esophageal cancer Neg Hx   . Rectal cancer Neg Hx   . Stomach cancer Neg Hx   . Prostate cancer Neg Hx    Social History   Occupational History  . Not on file  Tobacco Use  . Smoking status: Former Smoker    Packs/day: 1.00    Years: 30.00    Pack years: 30.00    Last attempt to quit: 05/03/2005    Years since quitting: 12.5  . Smokeless tobacco: Never Used  Substance and Sexual Activity  . Alcohol use: Yes     Alcohol/week: 0.6 oz    Types: 1 Glasses of wine per week  . Drug use: No  . Sexual activity: Yes    Birth control/protection: None, Post-menopausal   Tobacco Counseling Counseling given: Not Answered   Immunizations and Health Maintenance  There is no immunization history on file for this patient. Health Maintenance Due  Topic Date Due  . PNA vac Low Risk Adult (1 of 2 - PCV13) 08/06/2017    Activities of Daily Living In your present state of health, do you have any difficulty performing the following activities: 11/16/2017  Hearing? Y  Vision? N  Difficulty concentrating or making decisions? N  Walking or climbing stairs? N  Dressing or bathing? N  Doing errands, shopping? N  Some recent data might be hidden    Physical Exam   EKG- NSR with nonspecific ST abnormality-your baseline  Advanced Directives:      Assessment:    This is a routine wellness  examination for this patient   Vision/Hearing screen No exam data present  Dietary issues and exercise activities discussed:  Current Exercise Habits: Home exercise routine, Type of exercise: strength training/weights;walking, Time (Minutes): 60, Frequency (Times/Week): 7, Weekly Exercise (Minutes/Week): 420, Intensity: Moderate, Exercise limited by: None identified Goals- EKG- NSR with nonspecific ST abnormality-your baseline  Start Lisinopril 5mg  once daily Continue all other medications as directed Increase plain water intake Continue your healthy eating and regular exercise Imaging ordered Follow-up in 4 weeks, blood pressure check Overall you are doing GREAT!  Depression Screen PHQ 2/9 Scores 11/16/2017 08/17/2017 08/15/2017  PHQ - 2 Score 0 0 0  PHQ- 9 Score 0 0 -     Fall Risk Fall Risk  11/16/2017  Falls in the past year? No    Cognitive Function-Timed Get Up-WNL     6CIT Screen 11/16/2017  What Year? 0 points  What month? 0 points  What time? 0 points  Count back from 20 0 points  Months in  reverse 0 points  Repeat phrase 8 points  Total Score 8    Patient Care Team: Esaw Grandchild, NP as PCP - General (Family Medicine)     Plan:   Start Lisinopril 5mg  once daily Continue all other medications as directed Increase plain water intake Continue your healthy eating and regular exercise Imaging ordered Follow-up in 4 weeks, blood pressure check Overall you are doing GREAT!  I have personally reviewed and noted the following in the patient's chart:   . Medical and social history . Use of alcohol, tobacco or illicit drugs  . Current medications and supplements . Functional ability and status .  Nutritional status . Physical activity . Advanced directives . List of other physicians . Hospitalizations, surgeries, and ER visits in previous 12 months . Vitals . Screenings to include cognitive, depression, and falls . Referrals and appointments  In addition, I have reviewed and discussed with patient certain preventive protocols, quality metrics, and best practice recommendations. A written personalized care plan for preventive services as well as general preventive health recommendations were provided to patient.    Esaw Grandchild, NP 11/16/2017

## 2017-11-17 LAB — HEPATITIS C ANTIBODY

## 2017-11-29 ENCOUNTER — Other Ambulatory Visit: Payer: Self-pay | Admitting: Adult Health

## 2017-11-29 ENCOUNTER — Ambulatory Visit
Admission: RE | Admit: 2017-11-29 | Discharge: 2017-11-29 | Disposition: A | Discharge status: Home / Self Care | Payer: PPO | Source: Ambulatory Visit | Attending: Adult Health | Admitting: Adult Health

## 2017-11-29 DIAGNOSIS — Z136 Encounter for screening for cardiovascular disorders: Secondary | ICD-10-CM | POA: Diagnosis not present

## 2017-11-29 DIAGNOSIS — Z87891 Personal history of nicotine dependence: Secondary | ICD-10-CM | POA: Diagnosis not present

## 2017-11-29 DIAGNOSIS — Z122 Encounter for screening for malignant neoplasm of respiratory organs: Secondary | ICD-10-CM

## 2017-11-29 DIAGNOSIS — I2510 Atherosclerotic heart disease of native coronary artery without angina pectoris: Secondary | ICD-10-CM

## 2017-11-29 DIAGNOSIS — I2583 Coronary atherosclerosis due to lipid rich plaque: Principal | ICD-10-CM

## 2017-12-05 DIAGNOSIS — N401 Enlarged prostate with lower urinary tract symptoms: Secondary | ICD-10-CM | POA: Diagnosis not present

## 2017-12-05 DIAGNOSIS — R972 Elevated prostate specific antigen [PSA]: Secondary | ICD-10-CM | POA: Diagnosis not present

## 2017-12-05 DIAGNOSIS — E291 Testicular hypofunction: Secondary | ICD-10-CM | POA: Diagnosis not present

## 2017-12-06 DIAGNOSIS — L738 Other specified follicular disorders: Secondary | ICD-10-CM | POA: Diagnosis not present

## 2017-12-06 DIAGNOSIS — D485 Neoplasm of uncertain behavior of skin: Secondary | ICD-10-CM | POA: Diagnosis not present

## 2017-12-06 DIAGNOSIS — L570 Actinic keratosis: Secondary | ICD-10-CM | POA: Diagnosis not present

## 2017-12-06 DIAGNOSIS — D1801 Hemangioma of skin and subcutaneous tissue: Secondary | ICD-10-CM | POA: Diagnosis not present

## 2017-12-06 DIAGNOSIS — B353 Tinea pedis: Secondary | ICD-10-CM | POA: Diagnosis not present

## 2017-12-06 DIAGNOSIS — D225 Melanocytic nevi of trunk: Secondary | ICD-10-CM | POA: Diagnosis not present

## 2017-12-06 DIAGNOSIS — L821 Other seborrheic keratosis: Secondary | ICD-10-CM | POA: Diagnosis not present

## 2017-12-21 ENCOUNTER — Encounter: Payer: Self-pay | Admitting: Adult Health

## 2017-12-21 ENCOUNTER — Ambulatory Visit (INDEPENDENT_AMBULATORY_CARE_PROVIDER_SITE_OTHER): Payer: PPO | Admitting: Adult Health

## 2017-12-21 VITALS — BP 137/75 | HR 87 | Wt 174.0 lb

## 2017-12-21 DIAGNOSIS — I10 Essential (primary) hypertension: Secondary | ICD-10-CM | POA: Insufficient documentation

## 2017-12-21 NOTE — Progress Notes (Signed)
Subjective:    Patient ID: Ryan Beard, male    DOB: 02-Apr-1951, 67 y.o.   MRN: [id number]  HPI:  [name] is here for f/u:HTN He was started on Lisinopril 5mg  4 weeks ago He reports medication compliance, reports very mild/brief cough upon rising am that will quickly resolve once he "gets moving around in the morning" Home BP readings- SBPs 110-120, DBPs 60-70 HR 60-80s He denies CP/dyspnea/dizziness/HA/palpitatipns. He has lost >7 lbs since in 4 weeks by elimination of sugar and reducing CHO  Patient Care Team    Relationship Specialty Notifications Start End  Esaw Grandchild, NP PCP - General Family Medicine  08/16/17     Patient Active Problem List   Diagnosis Date Noted  . HTN, goal below 140/80 12/21/2017  . Abnormal PSA 08/17/2017  . Hyperlipidemia 08/17/2017  . Transient elevated blood pressure 08/17/2017  . Healthcare maintenance 08/17/2017  . Family history of diabetes mellitus in sister 08/17/2017     Past Medical History:  Diagnosis Date  . Abnormal PSA   . Hyperlipidemia      Past Surgical History:  Procedure Laterality Date  . APPENDECTOMY  1973  . CARDIAC CATHETERIZATION    . PROSTATE BIOPSY    . SEPTOPLASTY  1960  . TONSILLECTOMY  1960     Family History  Problem Relation Age of Onset  . Heart disease Mother   . Cancer Father        lymphoma  . Hypertension Father   . Alcohol abuse Sister   . Colon cancer Neg Hx   . Esophageal cancer Neg Hx   . Rectal cancer Neg Hx   . Stomach cancer Neg Hx   . Prostate cancer Neg Hx      Social History   Substance and Sexual Activity  Drug Use No     Social History   Substance and Sexual Activity  Alcohol Use Yes  . Alcohol/week: 1.0 standard drinks  . Types: 1 Glasses of wine per week     Social History   Tobacco Use  Smoking Status Former Smoker  . Packs/day: 1.00  . Years: 30.00  . Pack years: 30.00  . Last attempt to quit: 05/03/2005  . Years since quitting: 12.6   Smokeless Tobacco Never Used     Outpatient Encounter Medications as of 12/21/2017  Medication Sig  . Acetylcysteine (N-ACETYL-L-CYSTEINE PO) Take 600 mg by mouth daily.  Marland Kitchen aspirin 81 MG tablet Take 81 mg by mouth daily.  Marland Kitchen BLACK PEPPER-TURMERIC PO Take 600 mg by mouth daily.  . CHELATED MAGNESIUM PO Take 1 tablet by mouth daily.  . Cholecalciferol (VITAMIN D-3) 5000 UNITS TABS Take by mouth daily.  . Cyanocobalamin (B-12) 2500 MCG TABS Take by mouth daily.  Javier Docker Oil 500 MG CAPS Take 1 capsule by mouth daily.  Marland Kitchen L-Arginine 500 MG TABS Take 1 tablet by mouth daily.  Marland Kitchen lisinopril (PRINIVIL,ZESTRIL) 5 MG tablet Take 1 tablet (5 mg total) by mouth daily.  . Misc Natural Products (BETA-SITOSTEROL PLANT STEROLS) CAPS Take 160 mg by mouth daily.  . Prasterone, DHEA, (DHEA 50 PO) Take 1 tablet by mouth daily.  . Pregnenolone POWD 75 mg by Does not apply route daily.  Marland Kitchen Resveratrol 250 MG CAPS Take by mouth daily.  . rosuvastatin (CRESTOR) 5 MG tablet Take 1 tablet (5 mg total) by mouth daily.  . saw palmetto 160 MG capsule Take 160 mg by mouth 2 (two) times daily.  . tadalafil (  CIALIS) 5 MG tablet Take 5 mg by mouth daily as needed for erectile dysfunction.  . Testosterone 1.62 % GEL Place 6.5 % onto the skin.  Marland Kitchen UNABLE TO FIND daily. Med Name: Compounded Testosterone gel 6.5%  . UNABLE TO FIND daily. Med Name: ZMA b6 magnesium and zinc  . UNABLE TO FIND Take 1 tablet by mouth daily. Med Name: Enhanced PQQ with 50 mg Ubiquinol  . UNABLE TO FIND Take 150 mg by mouth daily. Med Name: K2 MK7  . UNABLE TO FIND Med Name: Olipure BP  . UNABLE TO FIND Take 12.5 mg by mouth daily. Med Name: I-Throid Iodine  . vitamin C (ASCORBIC ACID) 500 MG tablet Take 500 mg by mouth daily.  . vitamin E (VITAMIN E) 400 UNIT capsule Take 400 Units by mouth daily.   No facility-administered encounter medications on file as of 12/21/2017.     Allergies: Patient has no known allergies.  Body mass index is 25.7  kg/m.  Blood pressure 137/75, pulse 87, weight 174 lb (78.9 kg), SpO2 98 %.  Review of Systems  Constitutional: Negative for activity change, appetite change, chills, diaphoresis, fatigue, fever and unexpected weight change.  Eyes: Negative for visual disturbance.  Respiratory: Positive for cough. Negative for chest tightness, shortness of breath, wheezing and stridor.   Cardiovascular: Negative for chest pain, palpitations and leg swelling.  Neurological: Negative for dizziness and headaches.  Hematological: Does not bruise/bleed easily.       Objective:   Physical Exam  Constitutional: He is oriented to person, place, and time. He appears well-developed and well-nourished. No distress.  HENT:  Head: Normocephalic and atraumatic.  Right Ear: External ear normal.  Left Ear: External ear normal.  Nose: Nose normal.  Mouth/Throat: Oropharynx is clear and moist.  Cardiovascular: Normal rate, regular rhythm, normal heart sounds and intact distal pulses.  No murmur heard. Pulmonary/Chest: Effort normal and breath sounds normal. No stridor. No respiratory distress. He has no wheezes. He has no rales. He exhibits no tenderness.  Neurological: He is alert and oriented to person, place, and time.  Skin: Skin is warm and dry. Capillary refill takes less than 2 seconds. No rash noted. He is not diaphoretic. No erythema. No pallor.  Psychiatric: He has a normal mood and affect. His behavior is normal. Judgment and thought content normal.  Nursing note and vitals reviewed.     Assessment & Plan:   1. HTN, goal below 140/80     HTN, goal below 140/80 BP at goal 137/75 Home readings- SBPs 110-120 DBPs  60-70 Mild am cough that will resolve in few minutes If cough worsens, call and we will change class of antihypertensive    FOLLOW-UP:  Return in about 6 months (around 06/23/2018) for HTN, Regular Follow Up.

## 2017-12-21 NOTE — Patient Instructions (Signed)
Mediterranean Diet A Mediterranean diet refers to food and lifestyle choices that are based on the traditions of countries located on the [place]. This way of eating has been shown to help prevent certain conditions and improve outcomes for people who have chronic diseases, like kidney disease and heart disease. What are tips for following this plan? Lifestyle  Cook and eat meals together with your family, when possible.  Drink enough fluid to keep your urine clear or pale yellow.  Be physically active every day. This includes: ? Aerobic exercise like running or swimming. ? Leisure activities like gardening, walking, or housework.  Get 7-8 hours of sleep each night.  If recommended by your health care provider, drink red wine in moderation. This means 1 glass a day for nonpregnant women and 2 glasses a day for men. A glass of wine equals 5 oz (150 mL). Reading food labels  Check the serving size of packaged foods. For foods such as rice and pasta, the serving size refers to the amount of cooked product, not dry.  Check the total fat in packaged foods. Avoid foods that have saturated fat or trans fats.  Check the ingredients list for added sugars, such as corn syrup. Shopping  At the grocery store, buy most of your food from the areas near the walls of the store. This includes: ? Fresh fruits and vegetables (produce). ? Grains, beans, nuts, and seeds. Some of these may be available in unpackaged forms or large amounts (in bulk). ? Fresh seafood. ? Poultry and eggs. ? Low-fat dairy products.  Buy whole ingredients instead of prepackaged foods.  Buy fresh fruits and vegetables in-season from local farmers markets.  Buy frozen fruits and vegetables in resealable bags.  If you do not have access to quality fresh seafood, buy precooked frozen shrimp or canned fish, such as tuna, salmon, or sardines.  Buy small amounts of raw or cooked vegetables, salads, or olives from the  deli or salad bar at your store.  Stock your pantry so you always have certain foods on hand, such as olive oil, canned tuna, canned tomatoes, rice, pasta, and beans. Cooking  Cook foods with extra-virgin olive oil instead of using butter or other vegetable oils.  Have meat as a side dish, and have vegetables or grains as your main dish. This means having meat in small portions or adding small amounts of meat to foods like pasta or stew.  Use beans or vegetables instead of meat in common dishes like chili or lasagna.  Experiment with different cooking methods. Try roasting or broiling vegetables instead of steaming or sauteing them.  Add frozen vegetables to soups, stews, pasta, or rice.  Add nuts or seeds for added healthy fat at each meal. You can add these to yogurt, salads, or vegetable dishes.  Marinate fish or vegetables using olive oil, lemon juice, garlic, and fresh herbs. Meal planning  Plan to eat 1 vegetarian meal one day each week. Try to work up to 2 vegetarian meals, if possible.  Eat seafood 2 or more times a week.  Have healthy snacks readily available, such as: ? Vegetable sticks with hummus. ? Greek yogurt. ? Fruit and nut trail mix.  Eat balanced meals throughout the week. This includes: ? Fruit: 2-3 servings a day ? Vegetables: 4-5 servings a day ? Low-fat dairy: 2 servings a day ? Fish, poultry, or lean meat: 1 serving a day ? Beans and legumes: 2 or more servings a week ? Nuts   and seeds: 1-2 servings a day ? Whole grains: 6-8 servings a day ? Extra-virgin olive oil: 3-4 servings a day  Limit red meat and sweets to only a few servings a month What are my food choices?  Mediterranean diet ? Recommended ? Grains: Whole-grain pasta. Brown rice. Bulgar wheat. Polenta. Couscous. Whole-wheat bread. [place]. ? Vegetables: Artichokes. Beets. Broccoli. Cabbage. Carrots. Eggplant. Green beans. Chard. Kale. Spinach. Onions. Leeks. Peas. Squash.  Tomatoes. Peppers. Radishes. ? Fruits: Apples. Apricots. Avocado. Berries. Bananas. Cherries. Dates. Figs. Grapes. Lemons. Melon. Oranges. Peaches. Plums. Pomegranate. ? Meats and other protein foods: Beans. Almonds. Sunflower seeds. Pine nuts. Peanuts. [place]. Salmon. Scallops. Shrimp. [place]. Tilapia. Clams. Oysters. Eggs. ? Dairy: Low-fat milk. Cheese. Greek yogurt. ? Beverages: Water. Red wine. Herbal tea. ? Fats and oils: Extra virgin olive oil. Avocado oil. Grape seed oil. ? Sweets and desserts: [place] yogurt with honey. Baked apples. Poached pears. Trail mix. ? Seasoning and other foods: Basil. Cilantro. Coriander. Cumin. Mint. Parsley. Sage. Rosemary. Tarragon. Garlic. Oregano. Thyme. Pepper. Balsalmic vinegar. Tahini. Hummus. Tomato sauce. Olives. Mushrooms. ? Limit these ? Grains: Prepackaged pasta or rice dishes. Prepackaged cereal with added sugar. ? Vegetables: Deep fried potatoes (french fries). ? Fruits: Fruit canned in syrup. ? Meats and other protein foods: Beef. Pork. Lamb. Poultry with skin. Hot dogs. [place] Salines. ? Dairy: Ice cream. Sour cream. Whole milk. ? Beverages: Juice. Sugar-sweetened soft drinks. Beer. Liquor and spirits. ? Fats and oils: Butter. Canola oil. Vegetable oil. Beef fat (tallow). Lard. ? Sweets and desserts: Cookies. Cakes. Pies. Candy. ? Seasoning and other foods: Mayonnaise. Premade sauces and marinades. ? The items listed may not be a complete list. Talk with your dietitian about what dietary choices are right for you. Summary  The Mediterranean diet includes both food and lifestyle choices.  Eat a variety of fresh fruits and vegetables, beans, nuts, seeds, and whole grains.  Limit the amount of red meat and sweets that you eat.  Talk with your health care provider about whether it is safe for you to drink red wine in moderation. This means 1 glass a day for nonpregnant women and 2 glasses a day for men. A glass of wine equals 5 oz (150 mL). This information  is not intended to replace advice given to you by your health care provider. Make sure you discuss any questions you have with your health care provider. Document Released: 12/11/2015 Document Revised: 01/13/2016 Document Reviewed: 12/11/2015 Elsevier Interactive Patient Education  2018 [employer].   Hypertension Hypertension, commonly called high blood pressure, is when the force of blood pumping through the arteries is too strong. The arteries are the blood vessels that carry blood from the heart throughout the body. Hypertension forces the heart to work harder to pump blood and may cause arteries to become narrow or stiff. Having untreated or uncontrolled hypertension can cause heart attacks, strokes, kidney disease, and other problems. A blood pressure reading consists of a higher number over a lower number. Ideally, your blood pressure should be below 120/80. The first ("top") number is called the systolic pressure. It is a measure of the pressure in your arteries as your heart beats. The second ("bottom") number is called the diastolic pressure. It is a measure of the pressure in your arteries as the heart relaxes. What are the causes? The cause of this condition is not known. What increases the risk? Some risk factors for high blood pressure are under your control. Others are not. Factors you can  change  Smoking.  Having type 2 diabetes mellitus, high cholesterol, or both.  Not getting enough exercise or physical activity.  Being overweight.  Having too much fat, sugar, calories, or salt (sodium) in your diet.  Drinking too much alcohol. Factors that are difficult or impossible to change  Having chronic kidney disease.  Having a family history of high blood pressure.  Age. Risk increases with age.  Race. You may be at higher risk if you are African-American.  Gender. Men are at higher risk than women before age 70. After age 39, women are at higher risk than  men.  Having obstructive sleep apnea.  Stress. What are the signs or symptoms? Extremely high blood pressure (hypertensive crisis) may cause:  Headache.  Anxiety.  Shortness of breath.  Nosebleed.  Nausea and vomiting.  Severe chest pain.  Jerky movements you cannot control (seizures).  How is this diagnosed? This condition is diagnosed by measuring your blood pressure while you are seated, with your arm resting on a surface. The cuff of the blood pressure monitor will be placed directly against the skin of your upper arm at the level of your heart. It should be measured at least twice using the same arm. Certain conditions can cause a difference in blood pressure between your right and left arms. Certain factors can cause blood pressure readings to be lower or higher than normal (elevated) for a short period of time:  When your blood pressure is higher when you are in a health care provider's office than when you are at home, this is called white coat hypertension. Most people with this condition do not need medicines.  When your blood pressure is higher at home than when you are in a health care provider's office, this is called masked hypertension. Most people with this condition may need medicines to control blood pressure.  If you have a high blood pressure reading during one visit or you have normal blood pressure with other risk factors:  You may be asked to return on a different day to have your blood pressure checked again.  You may be asked to monitor your blood pressure at home for 1 week or longer.  If you are diagnosed with hypertension, you may have other blood or imaging tests to help your health care provider understand your overall risk for other conditions. How is this treated? This condition is treated by making healthy lifestyle changes, such as eating healthy foods, exercising more, and reducing your alcohol intake. Your health care provider may prescribe  medicine if lifestyle changes are not enough to get your blood pressure under control, and if:  Your systolic blood pressure is above 130.  Your diastolic blood pressure is above 80.  Your personal target blood pressure may vary depending on your medical conditions, your age, and other factors. Follow these instructions at home: Eating and drinking  Eat a diet that is high in fiber and potassium, and low in sodium, added sugar, and fat. An example eating plan is called the DASH (Dietary Approaches to Stop Hypertension) diet. To eat this way: ? Eat plenty of fresh fruits and vegetables. Try to fill half of your plate at each meal with fruits and vegetables. ? Eat whole grains, such as whole wheat pasta, brown rice, or whole grain bread. Fill about one quarter of your plate with whole grains. ? Eat or drink low-fat dairy products, such as skim milk or low-fat yogurt. ? Avoid fatty cuts of meat, processed  or cured meats, and poultry with skin. Fill about one quarter of your plate with lean proteins, such as fish, chicken without skin, beans, eggs, and tofu. ? Avoid premade and processed foods. These tend to be higher in sodium, added sugar, and fat.  Reduce your daily sodium intake. Most people with hypertension should eat less than 1,500 mg of sodium a day.  Limit alcohol intake to no more than 1 drink a day for nonpregnant women and 2 drinks a day for men. One drink equals 12 oz of beer, 5 oz of wine, or 1 oz of hard liquor. Lifestyle  Work with your health care provider to maintain a healthy body weight or to lose weight. Ask what an ideal weight is for you.  Get at least 30 minutes of exercise that causes your heart to beat faster (aerobic exercise) most days of the week. Activities may include walking, swimming, or biking.  Include exercise to strengthen your muscles (resistance exercise), such as pilates or lifting weights, as part of your weekly exercise routine. Try to do these types  of exercises for 30 minutes at least 3 days a week.  Do not use any products that contain nicotine or tobacco, such as cigarettes and e-cigarettes. If you need help quitting, ask your health care provider.  Monitor your blood pressure at home as told by your health care provider.  Keep all follow-up visits as told by your health care provider. This is important. Medicines  Take over-the-counter and prescription medicines only as told by your health care provider. Follow directions carefully. Blood pressure medicines must be taken as prescribed.  Do not skip doses of blood pressure medicine. Doing this puts you at risk for problems and can make the medicine less effective.  Ask your health care provider about side effects or reactions to medicines that you should watch for. Contact a health care provider if:  You think you are having a reaction to a medicine you are taking.  You have headaches that keep coming back (recurring).  You feel dizzy.  You have swelling in your ankles.  You have trouble with your vision. Get help right away if:  You develop a severe headache or confusion.  You have unusual weakness or numbness.  You feel faint.  You have severe pain in your chest or abdomen.  You vomit repeatedly.  You have trouble breathing. Summary  Hypertension is when the force of blood pumping through your arteries is too strong. If this condition is not controlled, it may put you at risk for serious complications.  Your personal target blood pressure may vary depending on your medical conditions, your age, and other factors. For most people, a normal blood pressure is less than 120/80.  Hypertension is treated with lifestyle changes, medicines, or a combination of both. Lifestyle changes include weight loss, eating a healthy, low-sodium diet, exercising more, and limiting alcohol. This information is not intended to replace advice given to you by your health care provider.  Make sure you discuss any questions you have with your health care provider. Document Released: 04/19/2005 Document Revised: 03/17/2016 Document Reviewed: 03/17/2016 Elsevier Interactive Patient Education  2018 [place] blood pressure is perfect, continue on Lisinopril 5mg  once daily. If morning cough worsens, please call clinic and we will switch blood pressure medication. Keep living your healthy life! Follow-up in 6 months. GREAT JOB! NICE TO SEE YOU!

## 2017-12-21 NOTE — Assessment & Plan Note (Signed)
BP at goal 137/75 Home readings- SBPs 110-120 DBPs  60-70 Mild am cough that will resolve in few minutes If cough worsens, call and we will change class of antihypertensive

## 2018-01-20 ENCOUNTER — Encounter: Payer: Self-pay | Admitting: Cardiology

## 2018-01-20 ENCOUNTER — Ambulatory Visit (INDEPENDENT_AMBULATORY_CARE_PROVIDER_SITE_OTHER): Payer: PPO | Admitting: Cardiology

## 2018-01-20 VITALS — BP 161/87 | HR 84 | Ht 69.0 in | Wt 172.4 lb

## 2018-01-20 DIAGNOSIS — I2584 Coronary atherosclerosis due to calcified coronary lesion: Secondary | ICD-10-CM | POA: Diagnosis not present

## 2018-01-20 DIAGNOSIS — Z7189 Other specified counseling: Secondary | ICD-10-CM

## 2018-01-20 DIAGNOSIS — I2510 Atherosclerotic heart disease of native coronary artery without angina pectoris: Secondary | ICD-10-CM

## 2018-01-20 DIAGNOSIS — I10 Essential (primary) hypertension: Secondary | ICD-10-CM

## 2018-01-20 NOTE — Patient Instructions (Signed)
Medication Instructions: Your physician recommends that you continue on your current medications as directed.    If you need a refill on your cardiac medications before your next appointment, please call your pharmacy.   Labwork: None  Procedures/Testing: None  Follow-Up: Your physician wants you to follow-up in 1 year with Dr. Harrell Gave. You will receive a reminder letter in the mail two months in advance. If you don't receive a letter, please call our office at [phone] to schedule this follow-up appointment.   Special Instructions:    Thank you for choosing [facility] at [facility]!!

## 2018-01-20 NOTE — Progress Notes (Signed)
Cardiology Office Note:    Date:  01/20/2018   ID:  Ryan Beard, DOB 02-Aug-1950, MRN [id number]  PCP:  Esaw Grandchild, NP  Cardiologist:  Buford Dresser, MD PhD  Referring MD: Esaw Grandchild, NP   CC: establish care  History of Present Illness:    Ryan Beard is a 67 y.o. male with a hx of hyperlipidemia, distant chest pain who is seen as a new consult at the request of Danford, Katy D, NP for the evaluation and management of cardiovascular risk.  About 7 years ago he was having issues with chest pain. Had heart cath at that time (see below) without any obstructive CAD. He has made major lifestyle modifications since that time. No longer a smoker, completely changed diet, highly active. Does high exertion activities like P90X. Also has been spending significant time and research into nutrition and supplements to bolster his lifestyle.   No chest pain, SOB, PND, orthopnea, syncope, palpitations.  Has noted that his blood pressure is elevated at doctor's visits. Has a wearable device that shows much lower readings. However, his device was discordant with both our manual cuff and automatic cuff by >40 points.  Past Medical History:  Diagnosis Date  . Abnormal PSA   . Hyperlipidemia   Prior heart cath in 2007, results below.  Past Surgical History:  Procedure Laterality Date  . APPENDECTOMY  1973  . CARDIAC CATHETERIZATION    . PROSTATE BIOPSY    . SEPTOPLASTY  1960  . TONSILLECTOMY  1960    Current Medications: Current Outpatient Medications on File Prior to Visit  Medication Sig  . Acetylcysteine (N-ACETYL-L-CYSTEINE PO) Take 600 mg by mouth daily.  Marland Kitchen aspirin 81 MG tablet Take 81 mg by mouth daily.  Marland Kitchen BLACK PEPPER-TURMERIC PO Take 600 mg by mouth daily.  . CHELATED MAGNESIUM PO Take 1 tablet by mouth daily.  . Cholecalciferol (VITAMIN D-3) 5000 UNITS TABS Take by mouth daily.  . Cyanocobalamin (B-12) 2500 MCG TABS Take by mouth daily.  Javier Docker Oil 500 MG  CAPS Take 1 capsule by mouth daily.  Marland Kitchen L-Arginine 500 MG TABS Take 1 tablet by mouth daily.  . Misc Natural Products (BETA-SITOSTEROL PLANT STEROLS) CAPS Take 160 mg by mouth daily.  . Prasterone, DHEA, (DHEA 50 PO) Take 1 tablet by mouth daily.  . Pregnenolone POWD 75 mg by Does not apply route daily.  Marland Kitchen Resveratrol 250 MG CAPS Take by mouth daily.  . rosuvastatin (CRESTOR) 5 MG tablet Take 1 tablet (5 mg total) by mouth daily.  . saw palmetto 160 MG capsule Take 160 mg by mouth 2 (two) times daily.  . tadalafil (CIALIS) 5 MG tablet Take 5 mg by mouth daily as needed for erectile dysfunction.  . Testosterone 1.62 % GEL Place 6.5 % onto the skin.  Marland Kitchen UNABLE TO FIND daily. Med Name: Compounded Testosterone gel 6.5%  . UNABLE TO FIND daily. Med Name: ZMA b6 magnesium and zinc  . UNABLE TO FIND Take 1 tablet by mouth daily. Med Name: Enhanced PQQ with 50 mg Ubiquinol  . UNABLE TO FIND Take 150 mg by mouth daily. Med Name: K2 MK7  . UNABLE TO FIND Med Name: Olipure BP  . UNABLE TO FIND Take 12.5 mg by mouth daily. Med Name: I-Throid Iodine  . vitamin C (ASCORBIC ACID) 500 MG tablet Take 500 mg by mouth daily.  . vitamin E (VITAMIN E) 400 UNIT capsule Take 400 Units by mouth daily.  No current facility-administered medications on file prior to visit.      Allergies:   Patient has no known allergies.   Social History   Socioeconomic History  . Marital status: Single    Spouse name: Not on file  . Number of children: Not on file  . Years of education: Not on file  . Highest education level: Not on file  Occupational History  . Not on file  Social Needs  . Financial resource strain: Not on file  . Food insecurity:    Worry: Not on file    Inability: Not on file  . Transportation needs:    Medical: Not on file    Non-medical: Not on file  Tobacco Use  . Smoking status: Former Smoker    Packs/day: 1.00    Years: 30.00    Pack years: 30.00    Last attempt to quit: 05/03/2005     Years since quitting: 12.7  . Smokeless tobacco: Never Used  Substance and Sexual Activity  . Alcohol use: Yes    Alcohol/week: 1.0 standard drinks    Types: 1 Glasses of wine per week  . Drug use: No  . Sexual activity: Yes    Birth control/protection: None, Post-menopausal  Lifestyle  . Physical activity:    Days per week: Not on file    Minutes per session: Not on file  . Stress: Not on file  Relationships  . Social connections:    Talks on phone: Not on file    Gets together: Not on file    Attends religious service: Not on file    Active member of club or organization: Not on file    Attends meetings of clubs or organizations: Not on file    Relationship status: Not on file  Other Topics Concern  . Not on file  Social History Narrative  . Not on file   Retired Art gallery manager. Extremely active.  Family History: The patient's family history includes Alcohol abuse in his sister; Cancer in his father; Heart disease in his mother; Hypertension in his father. There is no history of Colon cancer, Esophageal cancer, Rectal cancer, Stomach cancer, or Prostate cancer.  ROS:   Please see the history of present illness.  Additional pertinent ROS: Review of Systems  Constitutional: Negative for chills, fever and malaise/fatigue.  HENT: Negative for ear pain and hearing loss.   Eyes: Negative for blurred vision and pain.  Respiratory: Negative for shortness of breath and wheezing.   Cardiovascular: Negative for chest pain, palpitations, orthopnea, claudication, leg swelling and PND.  Gastrointestinal: Negative for abdominal pain, blood in stool and melena.  Genitourinary: Negative for dysuria and hematuria.       History of BPH, has occasional issues with urination  Musculoskeletal: Negative for falls, joint pain and myalgias.  Skin: Negative for rash.  Neurological: Negative for dizziness, focal weakness and loss of consciousness.  Endo/Heme/Allergies: Does not bruise/bleed  easily.   EKGs/Labs/Other Studies Reviewed:    The following studies were reviewed today: Cath 09/2010  IMPRESSION:  1. Normal left ventricular function with suggestion of mild      angiographic mitral valve prolapse.  2. Mild luminal irregularity without significant obstructive stenoses      with 10-20% narrowings involving the proximal to mid left anterior      descending artery, first diagonal branch of the left anterior      descending artery and right coronary artery.   RECOMMENDATIONS:  Medical therapy.  The patient  will be discharged home  later today with plans for follow up with Dr. Gwenlyn Found.  EKG:  EKG is ordered today.  The ekg ordered today demonstrates normal sinus rhythm with nonspecific ST changes. No Q waves.  Recent Labs: 11/09/2017: ALT 21; BUN 17; Creatinine, Ser 1.15; Hemoglobin 16.1; Platelets 159; Potassium 5.6; Sodium 144; TSH 2.440  Recent Lipid Panel    Component Value Date/Time   CHOL 184 11/09/2017 0837   TRIG 140 11/09/2017 0837   HDL 47 11/09/2017 0837   CHOLHDL 3.9 11/09/2017 0837   LDLCALC 109 (H) 11/09/2017 0837    Physical Exam:    VS:  BP (!) 161/87 (BP Location: Right Arm)   Pulse 84   Ht 5\' 9"  (1.753 m)   Wt 172 lb 6.4 oz (78.2 kg)   BMI 25.46 kg/m     Wt Readings from Last 3 Encounters:  01/20/18 172 lb 6.4 oz (78.2 kg)  12/21/17 174 lb (78.9 kg)  11/16/17 181 lb 11.2 oz (82.4 kg)     GEN: Well nourished, well developed in no acute distress HEENT: Normal NECK: No JVD; No carotid bruits LYMPHATICS: No lymphadenopathy CARDIAC: regular rhythm, normal S1 and S2, no murmurs, rubs, gallops. Radial and DP pulses 2+ bilaterally. RESPIRATORY:  Clear to auscultation without rales, wheezing or rhonchi  ABDOMEN: Soft, non-tender, non-distended MUSCULOSKELETAL:  No edema; No deformity  SKIN: Warm and dry NEUROLOGIC:  Alert and oriented x 3 PSYCHIATRIC:  Normal affect   ASSESSMENT:    1. Coronary artery calcification   2. Essential  hypertension   3. Counseling on health promotion and disease prevention    PLAN:    1. Hypertension: discussed risk of this at length. He is certain that his wearable tracker is accurate, and he also used wrist cuffs at home that mimic his wearable. His BP is very elevated in both arms today, and his wrist measurement is off by nearly 40 points systolic from both the automatic and manual cuffs in our office. He actually wanted to stop lisinopril entirely. I offered changing to amlodipine or chlorthalidone, but he would rather stick with the lisinopril. I think we should go up on the dose, but he is worried that because his wearable is already reading 110s-120s, going up on the dose would bottom him out. I encouraged him to get a more reliable cuff, such as an upper arm cuff, and bring it to his next PCP appointment to calibrate it. He will consider. Declined adjustment of lisinopril today.  2. Primary prevention of cardiovascular events: no history of MI/CVA. Nonobstructive coronary plaque on prior cath. However, does have significant coronary and atherosclerotic calcification on his CT chest. This was not a dedicated scan, so calcium score not calculated, but presence of CAC supports need for more aggressive prevention. The 10-year ASCVD risk score [name] DC Brooke Bonito., et al., 2013) is: 18.1%   Values used to calculate the score:     Age: 24 years     Sex: Male     Is Non-Hispanic African American: No     Diabetic: No     Tobacco smoker: No     Systolic Blood Pressure: 601 mmHg     Is BP treated: No     HDL Cholesterol: 47 mg/dL     Total Cholesterol: 184 mg/dL  On rosuvastatin 5 mg. Would consider increasing the dose to get his LDL less than 100, ideally closer to 70 with CAC and prior nonobstructive coronary plaque. Declined adjustment today.  Prevention: -recommend heart healthy/Mediterranean diet, with whole grains, fruits, vegetable, fish, lean meats, nuts, and olive oil. Limit salt. -he is  already doing very high level activity, encouraged this. -recommend avoidance of tobacco products. Avoid excess alcohol. -Additional risk factor control:  -Diabetes: A1c is 5.4  -Lipids: Tchol 184, TG 140, HDL 47, LDL 109  -Blood pressure control: as above  -Weight: at goal weight  Plan for follow up: 1 year or sooner PRN  Medication Adjustments/Labs and Tests Ordered: Current medicines are reviewed at length with the patient today.  Concerns regarding medicines are outlined above.  Orders Placed This Encounter  Procedures  . EKG 12-Lead   No orders of the defined types were placed in this encounter.   Patient Instructions  Medication Instructions: Your physician recommends that you continue on your current medications as directed.    If you need a refill on your cardiac medications before your next appointment, please call your pharmacy.   Labwork: None  Procedures/Testing: None  Follow-Up: Your physician wants you to follow-up in 1 year with Dr. Harrell Gave. You will receive a reminder letter in the mail two months in advance. If you don't receive a letter, please call our office at [phone] to schedule this follow-up appointment.   Special Instructions:    Thank you for choosing [facility] at [facility]!!       Signed, Buford Dresser, MD PhD 01/20/2018 10:22 AM    [place]

## 2018-04-06 DIAGNOSIS — Z79899 Other long term (current) drug therapy: Secondary | ICD-10-CM | POA: Diagnosis not present

## 2018-04-06 DIAGNOSIS — R972 Elevated prostate specific antigen [PSA]: Secondary | ICD-10-CM | POA: Diagnosis not present

## 2018-04-06 DIAGNOSIS — E291 Testicular hypofunction: Secondary | ICD-10-CM | POA: Diagnosis not present

## 2018-04-06 DIAGNOSIS — N401 Enlarged prostate with lower urinary tract symptoms: Secondary | ICD-10-CM | POA: Diagnosis not present

## 2018-05-07 ENCOUNTER — Encounter: Payer: Self-pay | Admitting: Gastroenterology

## 2018-05-29 ENCOUNTER — Other Ambulatory Visit: Payer: Self-pay | Admitting: Adult Health

## 2018-06-17 NOTE — Progress Notes (Signed)
Subjective:    Patient ID: Ryan Beard, male    DOB: [birth year], 68 y.o.   MRN: [id number]  HPI:  12/22/2018 OV: [name] is here for f/u: HTN He was started on Lisinopril 5mg  4 weeks ago He reports medication compliance, reports very mild/brief cough upon rising am that will quickly resolve once he "gets moving around in the morning" Home BP readings- SBPs 110-120, DBPs 60-70 HR 60-80s He denies CP/dyspnea/dizziness/HA/palpitatipns. He has lost >7 lbs since in 4 weeks by elimination of sugar and reducing CHO  06/21/2018 OV: [name] is here for 6 month f/u: HTN He was seen by Cardiology 01/2018-  BP was elevated and he declined increase in ACE and changing anti-hypertensive to amlodipine or chlorthalidone Also advised increasing rosuvastatin 5mg , he declined Today BP in clinic above goal-white coat syndrome Home readings- SBP 120-130s DBP 60-70 Continue Lisinopril 5mg  QD He continues to exercise vigorously- P90X, Golf, Walks >2 miles/day, chopping wood He follows heart healthy diet- grows most of his own food Drinks water all day, denies tobacco/vape use He denies acute complaints/issues today He reports occasional, non-productive cough has completely resolved   Patient Care Team    Relationship Specialty Notifications Start End  [place], Valetta Fuller D, NP PCP - General Family Medicine  08/16/17   Buford Dresser, MD PCP - Cardiology Cardiology Admissions 01/30/18     Patient Active Problem List   Diagnosis Date Noted  . HTN, goal below 140/80 12/21/2017  . Abnormal PSA 08/17/2017  . Hyperlipidemia 08/17/2017  . Transient elevated blood pressure 08/17/2017  . Healthcare maintenance 08/17/2017  . Family history of diabetes mellitus in sister 08/17/2017     Past Medical History:  Diagnosis Date  . Abnormal PSA   . Hyperlipidemia      Past Surgical History:  Procedure Laterality Date  . APPENDECTOMY  1973  . CARDIAC CATHETERIZATION    . PROSTATE BIOPSY     . SEPTOPLASTY  1960  . TONSILLECTOMY  1960     Family History  Problem Relation Age of Onset  . Heart disease Mother   . Cancer Father        lymphoma  . Hypertension Father   . Alcohol abuse Sister   . Colon cancer Neg Hx   . Esophageal cancer Neg Hx   . Rectal cancer Neg Hx   . Stomach cancer Neg Hx   . Prostate cancer Neg Hx      Social History   Substance and Sexual Activity  Drug Use No     Social History   Substance and Sexual Activity  Alcohol Use Yes  . Alcohol/week: 1.0 standard drinks  . Types: 1 Glasses of wine per week     Social History   Tobacco Use  Smoking Status Former Smoker  . Packs/day: 1.00  . Years: 30.00  . Pack years: 30.00  . Last attempt to quit: 05/03/2005  . Years since quitting: 13.1  Smokeless Tobacco Never Used     Outpatient Encounter Medications as of 06/21/2018  Medication Sig  . Acetylcysteine (N-ACETYL-L-CYSTEINE PO) Take 600 mg by mouth daily.  Marland Kitchen BLACK PEPPER-TURMERIC PO Take 600 mg by mouth daily.  . CHELATED MAGNESIUM PO Take 1 tablet by mouth daily.  . Cholecalciferol (VITAMIN D-3) 5000 UNITS TABS Take by mouth daily.  . Cyanocobalamin (B-12) 2500 MCG TABS Take by mouth daily.  Javier Docker Oil 500 MG CAPS Take 1 capsule by mouth daily.  Marland Kitchen L-Arginine 500 MG TABS Take  1 tablet by mouth daily.  Marland Kitchen lisinopril (PRINIVIL,ZESTRIL) 5 MG tablet Take 5 mg by mouth daily.  . Misc Natural Products (BETA-SITOSTEROL PLANT STEROLS) CAPS Take 160 mg by mouth daily.  . Prasterone, DHEA, (DHEA 50 PO) Take 1 tablet by mouth daily.  . Pregnenolone POWD 75 mg by Does not apply route daily.  Marland Kitchen Resveratrol 250 MG CAPS Take by mouth daily.  . rosuvastatin (CRESTOR) 5 MG tablet TAKE 1 TABLET BY MOUTH DAILY  . saw palmetto 160 MG capsule Take 160 mg by mouth 2 (two) times daily.  . tadalafil (CIALIS) 5 MG tablet Take 5 mg by mouth daily as needed for erectile dysfunction.  . Testosterone 1.62 % GEL Place 6.5 % onto the skin.  Marland Kitchen UNABLE TO  FIND daily. Med Name: Compounded Testosterone gel 6.5%  . UNABLE TO FIND daily. Med Name: ZMA b6 magnesium and zinc  . UNABLE TO FIND Take 1 tablet by mouth daily. Med Name: Enhanced PQQ with 50 mg Ubiquinol  . UNABLE TO FIND Take 150 mg by mouth daily. Med Name: K2 MK7  . UNABLE TO FIND Med Name: Olipure BP  . UNABLE TO FIND Take 12.5 mg by mouth daily. Med Name: I-Throid Iodine  . vitamin C (ASCORBIC ACID) 500 MG tablet Take 500 mg by mouth daily.  . vitamin E (VITAMIN E) 400 UNIT capsule Take 400 Units by mouth daily.  . [DISCONTINUED] aspirin 81 MG tablet Take 81 mg by mouth daily.   No facility-administered encounter medications on file as of 06/21/2018.     Allergies: Patient has no known allergies.  Body mass index is 26.15 kg/m.  Blood pressure (!) 154/74, pulse 66, temperature 97.8 F (36.6 C), temperature source Oral, height 5\' 9"  (1.753 m), weight 177 lb 1.6 oz (80.3 kg), SpO2 99 %.  Review of Systems  Constitutional: Negative for activity change, appetite change, chills, diaphoresis, fatigue, fever and unexpected weight change.  Eyes: Negative for visual disturbance.  Respiratory: Negative for cough, chest tightness, shortness of breath, wheezing and stridor.   Cardiovascular: Negative for chest pain, palpitations and leg swelling.  Neurological: Negative for dizziness and headaches.  Hematological: Does not bruise/bleed easily.       Objective:   Physical Exam Vitals signs and nursing note reviewed.  Constitutional:      General: He is not in acute distress.    Appearance: He is well-developed. He is not diaphoretic.  HENT:     Head: Normocephalic and atraumatic.     Right Ear: External ear normal.     Left Ear: External ear normal.  Cardiovascular:     Rate and Rhythm: Normal rate and regular rhythm.     Heart sounds: Normal heart sounds. No murmur.  Pulmonary:     Effort: Pulmonary effort is normal. No respiratory distress.     Breath sounds: Normal breath  sounds. No stridor. No wheezing or rales.  Chest:     Chest wall: No tenderness.  Skin:    General: Skin is warm and dry.     Capillary Refill: Capillary refill takes less than 2 seconds.     Coloration: Skin is not pale.     Findings: No erythema or rash.  Neurological:     Mental Status: He is alert and oriented to person, place, and time.  Psychiatric:        Behavior: Behavior normal.        Thought Content: Thought content normal.  Judgment: Judgment normal.       Assessment & Plan:   1. Hyperlipidemia, unspecified hyperlipidemia type   2. Healthcare maintenance   3. HTN, goal below 140/80   4. Abnormal PSA     Healthcare maintenance Continue all medications as directed. Remain well hydrated and continue eating a heart healthy diet. Continue your excellent level of activity. We will call you when lab results are available and if LDL (bad) cholesterol is still well above 70, will increase Rosuvastatin (Crestor) to 10mg  once daily. Continue to check blood pressure at home if consistently >140/90 or <100/60, please call clinic. Please schedule complete physical in 6 months.  HTN, goal below 140/80 Above goal in clinic- white coat syndrome Home readings- SBP 120-130s DBP 60-70 Continue Lisinopril 5mg  QD   Abnormal PSA Followed by Urology Q6M  Hyperlipidemia The 10-year ASCVD risk score [name] DC Jr., et al., 2013) is: 19.6%   Values used to calculate the score:     Age: 47 years     Sex: Male     Is Non-Hispanic African American: No     Diabetic: No     Tobacco smoker: No     Systolic Blood Pressure: 211 mmHg     Is BP treated: Yes     HDL Cholesterol: 47 mg/dL     Total Cholesterol: 184 mg/dL  LDL- 109  Lipids checked today and if LDL still well above 70, will increase Rosuvastatin from 5mg  to 10mg     FOLLOW-UP:  Return in about 6 months (around 12/20/2018) for CPE.

## 2018-06-21 ENCOUNTER — Ambulatory Visit (INDEPENDENT_AMBULATORY_CARE_PROVIDER_SITE_OTHER): Payer: PPO | Admitting: Adult Health

## 2018-06-21 ENCOUNTER — Encounter: Payer: Self-pay | Admitting: Adult Health

## 2018-06-21 VITALS — BP 154/74 | HR 66 | Temp 97.8°F | Ht 69.0 in | Wt 177.1 lb

## 2018-06-21 DIAGNOSIS — I10 Essential (primary) hypertension: Secondary | ICD-10-CM | POA: Diagnosis not present

## 2018-06-21 DIAGNOSIS — R972 Elevated prostate specific antigen [PSA]: Secondary | ICD-10-CM

## 2018-06-21 DIAGNOSIS — E785 Hyperlipidemia, unspecified: Secondary | ICD-10-CM

## 2018-06-21 DIAGNOSIS — Z0000 Encounter for general adult medical examination without abnormal findings: Secondary | ICD-10-CM

## 2018-06-21 NOTE — Assessment & Plan Note (Signed)
The 10-year ASCVD risk score [name] DC Brooke Bonito., et al., 2013) is: 19.6%   Values used to calculate the score:     Age: 68 years     Sex: Male     Is Non-Hispanic African American: No     Diabetic: No     Tobacco smoker: No     Systolic Blood Pressure: 692 mmHg     Is BP treated: Yes     HDL Cholesterol: 47 mg/dL     Total Cholesterol: 184 mg/dL  LDL- 109  Lipids checked today and if LDL still well above 70, will increase Rosuvastatin from 5mg  to 10mg 

## 2018-06-21 NOTE — Assessment & Plan Note (Signed)
Above goal in clinic- white coat syndrome Home readings- SBP 120-130s DBP 60-70 Continue Lisinopril 5mg  QD

## 2018-06-21 NOTE — Assessment & Plan Note (Signed)
Continue all medications as directed. Remain well hydrated and continue eating a heart healthy diet. Continue your excellent level of activity. We will call you when lab results are available and if LDL (bad) cholesterol is still well above 70, will increase Rosuvastatin (Crestor) to 10mg  once daily. Continue to check blood pressure at home if consistently >140/90 or <100/60, please call clinic. Please schedule complete physical in 6 months.

## 2018-06-21 NOTE — Assessment & Plan Note (Signed)
Followed by Urology Q6M

## 2018-06-21 NOTE — Patient Instructions (Addendum)
Managing Your Hypertension Hypertension is commonly called high blood pressure. This is when the force of your blood pressing against the walls of your arteries is too strong. Arteries are blood vessels that carry blood from your heart throughout your body. Hypertension forces the heart to work harder to pump blood, and may cause the arteries to become narrow or stiff. Having untreated or uncontrolled hypertension can cause heart attack, stroke, kidney disease, and other problems. What are blood pressure readings? A blood pressure reading consists of a higher number over a lower number. Ideally, your blood pressure should be below 120/80. The first ("top") number is called the systolic pressure. It is a measure of the pressure in your arteries as your heart beats. The second ("bottom") number is called the diastolic pressure. It is a measure of the pressure in your arteries as the heart relaxes. What does my blood pressure reading mean? Blood pressure is classified into four stages. Based on your blood pressure reading, your health care provider may use the following stages to determine what type of treatment you need, if any. Systolic pressure and diastolic pressure are measured in a unit called mm Hg. Normal  Systolic pressure: below 120.  Diastolic pressure: below 80. Elevated  Systolic pressure: 120-129.  Diastolic pressure: below 80. Hypertension stage 1  Systolic pressure: 130-139.  Diastolic pressure: 80-89. Hypertension stage 2  Systolic pressure: 140 or above.  Diastolic pressure: 90 or above. What health risks are associated with hypertension? Managing your hypertension is an important responsibility. Uncontrolled hypertension can lead to:  A heart attack.  A stroke.  A weakened blood vessel (aneurysm).  Heart failure.  Kidney damage.  Eye damage.  Metabolic syndrome.  Memory and concentration problems. What changes can I make to manage my  hypertension? Hypertension can be managed by making lifestyle changes and possibly by taking medicines. Your health care provider will help you make a plan to bring your blood pressure within a normal range. Eating and drinking   Eat a diet that is high in fiber and potassium, and low in salt (sodium), added sugar, and fat. An example eating plan is called the DASH (Dietary Approaches to Stop Hypertension) diet. To eat this way: ? Eat plenty of fresh fruits and vegetables. Try to fill half of your plate at each meal with fruits and vegetables. ? Eat whole grains, such as whole wheat pasta, brown rice, or whole grain bread. Fill about one quarter of your plate with whole grains. ? Eat low-fat diary products. ? Avoid fatty cuts of meat, processed or cured meats, and poultry with skin. Fill about one quarter of your plate with lean proteins such as fish, chicken without skin, beans, eggs, and tofu. ? Avoid premade and processed foods. These tend to be higher in sodium, added sugar, and fat.  Reduce your daily sodium intake. Most people with hypertension should eat less than 1,500 mg of sodium a day.  Limit alcohol intake to no more than 1 drink a day for nonpregnant women and 2 drinks a day for men. One drink equals 12 oz of beer, 5 oz of wine, or 1 oz of hard liquor. Lifestyle  Work with your health care provider to maintain a healthy body weight, or to lose weight. Ask what an ideal weight is for you.  Get at least 30 minutes of exercise that causes your heart to beat faster (aerobic exercise) most days of the week. Activities may include walking, swimming, or biking.  Include exercise   to strengthen your muscles (resistance exercise), such as weight lifting, as part of your weekly exercise routine. Try to do these types of exercises for 30 minutes at least 3 days a week.  Do not use any products that contain nicotine or tobacco, such as cigarettes and e-cigarettes. If you need help quitting,  ask your health care provider.  Control any long-term (chronic) conditions you have, such as high cholesterol or diabetes. Monitoring  Monitor your blood pressure at home as told by your health care provider. Your personal target blood pressure may vary depending on your medical conditions, your age, and other factors.  Have your blood pressure checked regularly, as often as told by your health care provider. Working with your health care provider  Review all the medicines you take with your health care provider because there may be side effects or interactions.  Talk with your health care provider about your diet, exercise habits, and other lifestyle factors that may be contributing to hypertension.  Visit your health care provider regularly. Your health care provider can help you create and adjust your plan for managing hypertension. Will I need medicine to control my blood pressure? Your health care provider may prescribe medicine if lifestyle changes are not enough to get your blood pressure under control, and if:  Your systolic blood pressure is 130 or higher.  Your diastolic blood pressure is 80 or higher. Take medicines only as told by your health care provider. Follow the directions carefully. Blood pressure medicines must be taken as prescribed. The medicine does not work as well when you skip doses. Skipping doses also puts you at risk for problems. Contact a health care provider if:  You think you are having a reaction to medicines you have taken.  You have repeated (recurrent) headaches.  You feel dizzy.  You have swelling in your ankles.  You have trouble with your vision. Get help right away if:  You develop a severe headache or confusion.  You have unusual weakness or numbness, or you feel faint.  You have severe pain in your chest or abdomen.  You vomit repeatedly.  You have trouble breathing. Summary  Hypertension is when the force of blood pumping  through your arteries is too strong. If this condition is not controlled, it may put you at risk for serious complications.  Your personal target blood pressure may vary depending on your medical conditions, your age, and other factors. For most people, a normal blood pressure is less than 120/80.  Hypertension is managed by lifestyle changes, medicines, or both. Lifestyle changes include weight loss, eating a healthy, low-sodium diet, exercising more, and limiting alcohol. This information is not intended to replace advice given to you by your health care provider. Make sure you discuss any questions you have with your health care provider. Document Released: 01/12/2012 Document Revised: 03/17/2016 Document Reviewed: 03/17/2016 Elsevier Interactive Patient Education  2019 [place] all medications as directed. Remain well hydrated and continue eating a heart healthy diet. Continue your excellent level of activity. We will call you when lab results are available and if LDL (bad) cholesterol is still well above 70, will increase Rosuvastatin (Crestor) to 10mg  once daily. Continue to check blood pressure at home if consistently >140/90 or <100/60, please call clinic. Please schedule complete physical in 6 months. GREAT TO SEE YOU!

## 2018-06-22 ENCOUNTER — Ambulatory Visit: Payer: PPO | Admitting: Adult Health

## 2018-06-22 LAB — LIPID PANEL
CHOL/HDL RATIO: 3.8 ratio (ref 0.0–5.0)
Cholesterol, Total: 177 mg/dL (ref 100–199)
HDL: 47 mg/dL (ref >39)
LDL CALC: 102 mg/dL — AB (ref 0–99)
TRIGLYCERIDES: 142 mg/dL (ref 0–149)
VLDL Cholesterol Cal: 28 mg/dL (ref 5–40)

## 2018-06-23 ENCOUNTER — Other Ambulatory Visit: Payer: Self-pay

## 2018-06-23 MED ORDER — ROSUVASTATIN CALCIUM 10 MG PO TABS: 10.0000 mg | ORAL_TABLET | Freq: Every day | ORAL | 0 refills | Status: DC

## 2018-07-04 NOTE — Progress Notes (Signed)
No show

## 2018-10-12 DIAGNOSIS — H52223 Regular astigmatism, bilateral: Secondary | ICD-10-CM | POA: Diagnosis not present

## 2018-10-12 DIAGNOSIS — H524 Presbyopia: Secondary | ICD-10-CM | POA: Diagnosis not present

## 2018-10-12 DIAGNOSIS — H5203 Hypermetropia, bilateral: Secondary | ICD-10-CM | POA: Diagnosis not present

## 2018-10-12 DIAGNOSIS — H2513 Age-related nuclear cataract, bilateral: Secondary | ICD-10-CM | POA: Diagnosis not present

## 2018-10-30 ENCOUNTER — Other Ambulatory Visit: Payer: Self-pay | Admitting: Adult Health

## 2018-11-06 ENCOUNTER — Other Ambulatory Visit: Payer: Self-pay | Admitting: Adult Health

## 2018-11-06 NOTE — Telephone Encounter (Signed)
We have not prescribed these medications for the patient previously.  Please review and refill if appropriate.  T. Parthena Fergeson, CMA  

## 2018-11-23 ENCOUNTER — Telehealth: Payer: Self-pay | Admitting: Adult Health

## 2018-11-23 DIAGNOSIS — M25561 Pain in right knee: Secondary | ICD-10-CM

## 2018-11-23 NOTE — Telephone Encounter (Signed)
Patient called states trouble w/ knee when playing golf & is requesting referral to have Physical therapy to decrease discomfort--- Pt will call Monday next week with the name of the practice.  --forwarding request to medical assistant.  --Dion Body

## 2018-11-27 NOTE — Telephone Encounter (Signed)
Patient brought by PT provider card: name Ryan Beard @ [facility] Phone: [phone] Fax:[phone]

## 2018-12-04 DIAGNOSIS — E291 Testicular hypofunction: Secondary | ICD-10-CM | POA: Diagnosis not present

## 2018-12-04 DIAGNOSIS — R972 Elevated prostate specific antigen [PSA]: Secondary | ICD-10-CM | POA: Diagnosis not present

## 2018-12-04 DIAGNOSIS — N401 Enlarged prostate with lower urinary tract symptoms: Secondary | ICD-10-CM | POA: Diagnosis not present

## 2018-12-04 DIAGNOSIS — Z79899 Other long term (current) drug therapy: Secondary | ICD-10-CM | POA: Diagnosis not present

## 2018-12-04 LAB — BASIC METABOLIC PANEL
BUN: 13 (ref 4–21)
Creatinine: 0.9 (ref 0.6–1.3)
Glucose: 101
Potassium: 4.6 (ref 3.4–5.3)
Sodium: 138 (ref 137–147)

## 2018-12-04 LAB — CBC AND DIFFERENTIAL
HCT: 43 (ref 41–53)
Hemoglobin: 14.9 (ref 13.5–17.5)
Neutrophils Absolute: 3
Platelets: 174 (ref 150–399)
WBC: 6

## 2018-12-04 LAB — FREE PSA: PSA, FREE: 1.56

## 2018-12-04 LAB — HEPATIC FUNCTION PANEL
ALT: 30 (ref 10–40)
AST: 20 (ref 14–40)
Alkaline Phosphatase: 43 (ref 25–125)

## 2018-12-04 LAB — PSA: PSA: 10.8

## 2018-12-04 LAB — TESTOSTERONE

## 2018-12-04 LAB — ESTRADIOL: Estradiol: 14.1

## 2018-12-04 LAB — LIPID PANEL
Cholesterol: 174 (ref 0–200)
HDL: 45 (ref 35–70)
LDL Cholesterol: 101
Triglycerides: 141 (ref 40–160)

## 2018-12-06 DIAGNOSIS — D225 Melanocytic nevi of trunk: Secondary | ICD-10-CM | POA: Diagnosis not present

## 2018-12-06 DIAGNOSIS — B078 Other viral warts: Secondary | ICD-10-CM | POA: Diagnosis not present

## 2018-12-06 DIAGNOSIS — L821 Other seborrheic keratosis: Secondary | ICD-10-CM | POA: Diagnosis not present

## 2018-12-06 DIAGNOSIS — L570 Actinic keratosis: Secondary | ICD-10-CM | POA: Diagnosis not present

## 2018-12-06 DIAGNOSIS — D1801 Hemangioma of skin and subcutaneous tissue: Secondary | ICD-10-CM | POA: Diagnosis not present

## 2018-12-11 DIAGNOSIS — S76811D Strain of other specified muscles, fascia and tendons at thigh level, right thigh, subsequent encounter: Secondary | ICD-10-CM | POA: Diagnosis not present

## 2018-12-13 ENCOUNTER — Other Ambulatory Visit: Payer: Self-pay

## 2018-12-13 ENCOUNTER — Other Ambulatory Visit: Payer: PPO

## 2018-12-13 DIAGNOSIS — Z0000 Encounter for general adult medical examination without abnormal findings: Secondary | ICD-10-CM

## 2018-12-13 DIAGNOSIS — E785 Hyperlipidemia, unspecified: Secondary | ICD-10-CM | POA: Diagnosis not present

## 2018-12-13 DIAGNOSIS — Z833 Family history of diabetes mellitus: Secondary | ICD-10-CM

## 2018-12-13 DIAGNOSIS — R030 Elevated blood-pressure reading, without diagnosis of hypertension: Secondary | ICD-10-CM

## 2018-12-14 LAB — COMPREHENSIVE METABOLIC PANEL
ALT: 31 IU/L (ref 0–44)
AST: 26 IU/L (ref 0–40)
Albumin/Globulin Ratio: 3 — ABNORMAL HIGH (ref 1.2–2.2)
Albumin: 4.8 g/dL (ref 3.8–4.8)
Alkaline Phosphatase: 38 IU/L — ABNORMAL LOW (ref 39–117)
BUN/Creatinine Ratio: 15 (ref 10–24)
BUN: 12 mg/dL (ref 8–27)
Bilirubin Total: 0.4 mg/dL (ref 0.0–1.2)
CO2: 22 mmol/L (ref 20–29)
Calcium: 9.3 mg/dL (ref 8.6–10.2)
Chloride: 103 mmol/L (ref 96–106)
Creatinine, Ser: 0.82 mg/dL (ref 0.76–1.27)
GFR calc Af Amer: 107 mL/min/{1.73_m2} (ref >59)
GFR calc non Af Amer: 92 mL/min/{1.73_m2} (ref >59)
Globulin, Total: 1.6 g/dL (ref 1.5–4.5)
Glucose: 105 mg/dL — ABNORMAL HIGH (ref 65–99)
Potassium: 4.5 mmol/L (ref 3.5–5.2)
Sodium: 141 mmol/L (ref 134–144)
Total Protein: 6.4 g/dL (ref 6.0–8.5)

## 2018-12-14 LAB — LIPID PANEL
Chol/HDL Ratio: 3.9 ratio (ref 0.0–5.0)
Cholesterol, Total: 177 mg/dL (ref 100–199)
HDL: 45 mg/dL (ref >39)
LDL Calculated: 101 mg/dL — ABNORMAL HIGH (ref 0–99)
Triglycerides: 157 mg/dL — ABNORMAL HIGH (ref 0–149)
VLDL Cholesterol Cal: 31 mg/dL (ref 5–40)

## 2018-12-14 LAB — CBC WITH DIFFERENTIAL/PLATELET
Basophils Absolute: 0 10*3/uL (ref 0.0–0.2)
Basos: 1 %
EOS (ABSOLUTE): 0.2 10*3/uL (ref 0.0–0.4)
Eos: 3 %
Hematocrit: 42.1 % (ref 37.5–51.0)
Hemoglobin: 14.3 g/dL (ref 13.0–17.7)
Immature Grans (Abs): 0 10*3/uL (ref 0.0–0.1)
Immature Granulocytes: 0 %
Lymphocytes Absolute: 1.8 10*3/uL (ref 0.7–3.1)
Lymphs: 29 %
MCH: 30.2 pg (ref 26.6–33.0)
MCHC: 34 g/dL (ref 31.5–35.7)
MCV: 89 fL (ref 79–97)
Monocytes Absolute: 0.4 10*3/uL (ref 0.1–0.9)
Monocytes: 7 %
Neutrophils Absolute: 3.7 10*3/uL (ref 1.4–7.0)
Neutrophils: 60 %
Platelets: 172 10*3/uL (ref 150–450)
RBC: 4.74 x10E6/uL (ref 4.14–5.80)
RDW: 12.8 % (ref 11.6–15.4)
WBC: 6.1 10*3/uL (ref 3.4–10.8)

## 2018-12-14 LAB — HEMOGLOBIN A1C
Est. average glucose Bld gHb Est-mCnc: 108 mg/dL
Hgb A1c MFr Bld: 5.4 % (ref 4.8–5.6)

## 2018-12-14 LAB — TSH: TSH: 3.47 u[IU]/mL (ref 0.450–4.500)

## 2018-12-14 NOTE — Progress Notes (Addendum)
Subjective:    Patient ID: Ryan Beard, male    DOB: Jun 23, 1950, 68 y.o.   MRN: [id number]  HPI:  Ryan Beard is here for CPE He reports medication compliance, denies SE He remains quite active  He reports ambulatory BP  SBP 130-140s DBP 60-70s He has finally switched to taking BP on upper arm BP cuff- not using wrist cuff anymore He is not agreeable increase in ACE dosage  Followed by Urology for low T and abnormal PSA 12/07/2018 PSA- 10.8, reduction from 18.1  12/14/2018 Labs- My Notes-  TSH-WNL, 3.470  A1c-WNL, 5.4  CBC-stable  CMP-stable  The 10-year ASCVD risk score [name] DC Jr., et al., 2013) is: 21%  Values used to calculate the score:   Age: 36 years   Sex: Male   Is Non-Hispanic African American: No   Diabetic: No   Tobacco smoker: No   Systolic Blood Pressure: 277 mmHg   Is BP treated: Yes   HDL Cholesterol: 45 mg/dL   Total Cholesterol: 177 mg/dL  LDL-102  Currently on Rosuvastatin 10mg , needs to be increased   01/2018- 1. Hypertension: discussed risk of this at length. He is certain that his wearable tracker is accurate, and he also used wrist cuffs at home that mimic his wearable. His BP is very elevated in both arms today, and his wrist measurement is off by nearly 40 points systolic from both the automatic and manual cuffs in our office. He actually wanted to stop lisinopril entirely. I offered changing to amlodipine or chlorthalidone, but he would rather stick with the lisinopril. I think we should go up on the dose, but he is worried that because his wearable is already reading 110s-120s, going up on the dose would bottom him out. I encouraged him to get a more reliable cuff, such as an upper arm cuff, and bring it to his next PCP appointment to calibrate it. He will consider. Declined adjustment of lisinopril today.  2. Primary prevention of cardiovascular events: no history of MI/CVA. Nonobstructive coronary plaque on prior  cath. However, does have significant coronary and atherosclerotic calcification on his CT chest. This was not a dedicated scan, so calcium score not calculated, but presence of CAC supports need for more aggressive prevention. The 10-year ASCVD risk score [name] DC Brooke Bonito., et al., 2013) is: 18.1%   Values used to calculate the score:     Age: 64 years     Sex: Male     Is Non-Hispanic African American: No     Diabetic: No     Tobacco smoker: No     Systolic Blood Pressure: 412 mmHg     Is BP treated: No     HDL Cholesterol: 47 mg/dL     Total Cholesterol: 184 mg/dL  On rosuvastatin 5 mg. Would consider increasing the dose to get his LDL less than 100, ideally closer to 70 with CAC and prior nonobstructive coronary plaque. Declined adjustment today.  Prevention: -recommend heart healthy/Mediterranean diet, with whole grains, fruits, vegetable, fish, lean meats, nuts, and olive oil. Limit salt. -he is already doing very high level activity, encouraged this. -recommend avoidance of tobacco products. Avoid excess alcohol. -Additional risk factor control:             -Diabetes: A1c is 5.4             -Lipids: Tchol 184, TG 140, HDL 47, LDL 109             -Blood  pressure control: as above             -Weight: at goal weight  Plan for follow up: 1 year or sooner PRN  Healthcare Maintenance: Colonoscopy-last 2014, due 2019- agreeable to Cologuard Immunizations-Tdap updated today LDCT-due, ordered  AAA Screening-last 10/2017-normal   Patient Care Team    Relationship Specialty Notifications Start End  Mina Marble D, NP PCP - General Family Medicine  08/16/17   Buford Dresser, MD PCP - Cardiology Cardiology Admissions 01/30/18     Patient Active Problem List   Diagnosis Date Noted  . HTN (hypertension) 12/21/2017  . Abnormal PSA 08/17/2017  . Hyperlipidemia 08/17/2017  . Transient elevated blood pressure 08/17/2017  . Healthcare maintenance 08/17/2017  . Family history of  diabetes mellitus in sister 08/17/2017     Past Medical History:  Diagnosis Date  . Abnormal PSA   . Hyperlipidemia      Past Surgical History:  Procedure Laterality Date  . APPENDECTOMY  1973  . CARDIAC CATHETERIZATION    . PROSTATE BIOPSY    . SEPTOPLASTY  1960  . TONSILLECTOMY  1960     Family History  Problem Relation Age of Onset  . Heart disease Mother   . Cancer Father        lymphoma  . Hypertension Father   . Alcohol abuse Sister   . Colon cancer Neg Hx   . Esophageal cancer Neg Hx   . Rectal cancer Neg Hx   . Stomach cancer Neg Hx   . Prostate cancer Neg Hx      Social History   Substance and Sexual Activity  Drug Use No     Social History   Substance and Sexual Activity  Alcohol Use Yes  . Alcohol/week: 1.0 standard drinks  . Types: 1 Glasses of wine per week     Social History   Tobacco Use  Smoking Status Former Smoker  . Packs/day: 1.00  . Years: 30.00  . Pack years: 30.00  . Quit date: 05/03/2005  . Years since quitting: 13.6  Smokeless Tobacco Never Used     Outpatient Encounter Medications as of 12/20/2018  Medication Sig  . Acetylcysteine (N-ACETYL-L-CYSTEINE PO) Take 600 mg by mouth daily.  Marland Kitchen BLACK PEPPER-TURMERIC PO Take 600 mg by mouth daily.  . CHELATED MAGNESIUM PO Take 1 tablet by mouth daily.  . Cholecalciferol (VITAMIN D-3) 5000 UNITS TABS Take by mouth daily.  . Cyanocobalamin (B-12) 2500 MCG TABS Take by mouth daily.  Javier Docker Oil 500 MG CAPS Take 1 capsule by mouth daily.  Marland Kitchen L-Arginine 500 MG TABS Take 1 tablet by mouth daily.  Marland Kitchen lisinopril (ZESTRIL) 5 MG tablet TAKE 1 TABLET BY MOUTH DAILY  . Misc Natural Products (BETA-SITOSTEROL PLANT STEROLS) CAPS Take 160 mg by mouth daily.  . Prasterone, DHEA, (DHEA 50 PO) Take 1 tablet by mouth daily.  . Pregnenolone POWD 75 mg by Does not apply route daily.  Marland Kitchen Resveratrol 250 MG CAPS Take by mouth daily.  . rosuvastatin (CRESTOR) 20 MG tablet Take 0.5 tablets (10 mg total)  by mouth daily.  . saw palmetto 160 MG capsule Take 160 mg by mouth 2 (two) times daily.  . tadalafil (CIALIS) 5 MG tablet Take 5 mg by mouth daily as needed for erectile dysfunction.  . Testosterone 1.62 % GEL Place 6.5 % onto the skin.  Marland Kitchen UNABLE TO FIND daily. Med Name: Compounded Testosterone gel 6.5%  . UNABLE TO FIND daily. Med Name: Regan Lemming  b6 magnesium and zinc  . UNABLE TO FIND Take 1 tablet by mouth daily. Med Name: Enhanced PQQ with 50 mg Ubiquinol  . UNABLE TO FIND Take 150 mg by mouth daily. Med Name: K2 MK7  . UNABLE TO FIND Med Name: Olipure BP  . UNABLE TO FIND Take 12.5 mg by mouth daily. Med Name: I-Throid Iodine  . vitamin C (ASCORBIC ACID) 500 MG tablet Take 500 mg by mouth daily.  . vitamin E (VITAMIN E) 400 UNIT capsule Take 400 Units by mouth daily.  . [DISCONTINUED] rosuvastatin (CRESTOR) 10 MG tablet TAKE 1 TABLET BY MOUTH DAILY   No facility-administered encounter medications on file as of 12/20/2018.     Allergies: Patient has no known allergies.  Body mass index is 24.96 kg/m.  Blood pressure (!) 170/66, pulse 89, temperature 99 F (37.2 C), height 5\' 9"  (1.753 m), weight 169 lb (76.7 kg), SpO2 99 %.  Review of Systems  Constitutional: Positive for fatigue. Negative for activity change, appetite change, chills, diaphoresis, fever and unexpected weight change.  HENT: Negative for congestion.   Eyes: Negative for visual disturbance.  Respiratory: Negative for cough, chest tightness, shortness of breath, wheezing and stridor.   Cardiovascular: Negative for chest pain, palpitations and leg swelling.  Gastrointestinal: Negative for abdominal distention, abdominal pain, blood in stool, constipation, diarrhea, nausea and vomiting.  Endocrine: Negative for cold intolerance, heat intolerance, polydipsia, polyphagia and polyuria.  Genitourinary: Negative for difficulty urinating and flank pain.  Musculoskeletal: Negative for arthralgias, back pain, gait problem, joint  swelling, myalgias, neck pain and neck stiffness.  Skin: Negative for color change, pallor, rash and wound.  Neurological: Negative for dizziness and headaches.  Hematological: Negative for adenopathy. Does not bruise/bleed easily.  Psychiatric/Behavioral: Negative for agitation, behavioral problems, confusion, decreased concentration, dysphoric mood, hallucinations, self-injury, sleep disturbance and suicidal ideas. The patient is not nervous/anxious and is not hyperactive.        Objective:   Physical Exam Vitals signs and nursing note reviewed.  Constitutional:      General: He is not in acute distress.    Appearance: Normal appearance. He is normal weight. He is not ill-appearing, toxic-appearing or diaphoretic.  HENT:     Head: Normocephalic.     Right Ear: Tympanic membrane, ear canal and external ear normal. There is no impacted cerumen.     Left Ear: Tympanic membrane, ear canal and external ear normal. There is no impacted cerumen.     Nose: Nose normal. No congestion.     Mouth/Throat:     Mouth: Mucous membranes are moist.  Eyes:     Extraocular Movements: Extraocular movements intact.     Conjunctiva/sclera: Conjunctivae normal.     Pupils: Pupils are equal, round, and reactive to light.  Neck:     Musculoskeletal: Normal range of motion and neck supple.  Cardiovascular:     Rate and Rhythm: Normal rate and regular rhythm.     Pulses: Normal pulses.     Heart sounds: Normal heart sounds. No murmur. No friction rub.  Pulmonary:     Effort: Pulmonary effort is normal. No respiratory distress.     Breath sounds: Normal breath sounds. No stridor. No wheezing, rhonchi or rales.  Chest:     Chest wall: No tenderness.  Abdominal:     General: Abdomen is flat. Bowel sounds are normal. There is no distension.     Palpations: Abdomen is soft. There is no mass.     Tenderness: There  is no abdominal tenderness. There is no right CVA tenderness, left CVA tenderness, guarding or  rebound.     Hernia: No hernia is present.  Musculoskeletal: Normal range of motion.        General: No tenderness.  Skin:    General: Skin is warm and dry.     Capillary Refill: Capillary refill takes less than 2 seconds.  Neurological:     Mental Status: He is alert and oriented to person, place, and time.  Psychiatric:        Mood and Affect: Mood normal.        Behavior: Behavior normal.        Thought Content: Thought content normal.        Judgment: Judgment normal.        Assessment & Plan:   1. Pulmonary nodules   2. On statin therapy   3. Hyperlipidemia, unspecified hyperlipidemia type   4. Need for Tdap vaccination   5. Healthcare maintenance   6. Abnormal PSA   7. Essential hypertension   8. Screening for colon cancer     Healthcare maintenance Continue all medications as directed, with one change- Increased Rosuvastatin from 10mg  to 20mg . Continue to drink plenty of water, follow heart healthy diet, and keep up your active lifestyle. Please schedule non-fasting lab appt in 6 weeks, re: Hepatic Function panel. Continue to check your blood pressure at home- if consistently <130/80-please call clinic. Follow-up 6 months. Continue to social distance and wear a mask when in public.  Abnormal PSA 12/06/2018 PSA-10.8  Hyperlipidemia The 10-year ASCVD risk score [name] DC Jr., et al., 2013) is: 21%  Values used to calculate the score:   Age: 45 years   Sex: Male   Is Non-Hispanic African American: No   Diabetic: No   Tobacco smoker: No   Systolic Blood Pressure: 664 mmHg   Is BP treated: Yes   HDL Cholesterol: 45 mg/dL   Total Cholesterol: 177 mg/dL  LDL-102  Agreeable to increase in Rosuvastatin from 10mg  to 20mg    HTN (hypertension) Please schedule non-fasting lab appt in 6 weeks, re: Hepatic Function panel. Continue to check your blood pressure at home- if consistently >130/80-please call clinic. Lisinopril 5mg - refuses  increase in dosage    FOLLOW-UP:  Return in about 6 months (around 06/22/2019) for HTN, Hypercholestermia, Regular Follow Up.

## 2018-12-20 ENCOUNTER — Encounter: Payer: Self-pay | Admitting: Adult Health

## 2018-12-20 ENCOUNTER — Other Ambulatory Visit: Payer: Self-pay

## 2018-12-20 ENCOUNTER — Ambulatory Visit (INDEPENDENT_AMBULATORY_CARE_PROVIDER_SITE_OTHER): Payer: PPO | Admitting: Adult Health

## 2018-12-20 VITALS — BP 170/66 | HR 89 | Temp 99.0°F | Ht 69.0 in | Wt 169.0 lb

## 2018-12-20 DIAGNOSIS — Z0000 Encounter for general adult medical examination without abnormal findings: Secondary | ICD-10-CM

## 2018-12-20 DIAGNOSIS — R918 Other nonspecific abnormal finding of lung field: Secondary | ICD-10-CM | POA: Diagnosis not present

## 2018-12-20 DIAGNOSIS — Z23 Encounter for immunization: Secondary | ICD-10-CM

## 2018-12-20 DIAGNOSIS — E785 Hyperlipidemia, unspecified: Secondary | ICD-10-CM | POA: Diagnosis not present

## 2018-12-20 DIAGNOSIS — Z1211 Encounter for screening for malignant neoplasm of colon: Secondary | ICD-10-CM

## 2018-12-20 DIAGNOSIS — I10 Essential (primary) hypertension: Secondary | ICD-10-CM

## 2018-12-20 DIAGNOSIS — Z79899 Other long term (current) drug therapy: Secondary | ICD-10-CM

## 2018-12-20 DIAGNOSIS — R972 Elevated prostate specific antigen [PSA]: Secondary | ICD-10-CM | POA: Diagnosis not present

## 2018-12-20 MED ORDER — ROSUVASTATIN CALCIUM 20 MG PO TABS: 10.0000 mg | ORAL_TABLET | Freq: Every day | ORAL | 0 refills | Status: DC

## 2018-12-20 NOTE — Assessment & Plan Note (Addendum)
Please schedule non-fasting lab appt in 6 weeks, re: Hepatic Function panel. Continue to check your blood pressure at home- if consistently >130/80-please call clinic. Lisinopril 5mg - refuses increase in dosage

## 2018-12-20 NOTE — Assessment & Plan Note (Signed)
The 10-year ASCVD risk score [name] DC Brooke Bonito., et al., 2013) is: 21%  Values used to calculate the score:   Age: 68 years   Sex: Male   Is Non-Hispanic African American: No   Diabetic: No   Tobacco smoker: No   Systolic Blood Pressure: 728 mmHg   Is BP treated: Yes   HDL Cholesterol: 45 mg/dL   Total Cholesterol: 177 mg/dL  LDL-102  Agreeable to increase in Rosuvastatin from 10mg  to 20mg 

## 2018-12-20 NOTE — Patient Instructions (Addendum)

## 2018-12-20 NOTE — Assessment & Plan Note (Signed)
12/06/2018 PSA-10.8

## 2018-12-20 NOTE — Assessment & Plan Note (Signed)
Continue all medications as directed, with one change- Increased Rosuvastatin from 10mg  to 20mg . Continue to drink plenty of water, follow heart healthy diet, and keep up your active lifestyle. Please schedule non-fasting lab appt in 6 weeks, re: Hepatic Function panel. Continue to check your blood pressure at home- if consistently <130/80-please call clinic. Follow-up 6 months. Continue to social distance and wear a mask when in public.

## 2018-12-27 ENCOUNTER — Ambulatory Visit
Admission: RE | Admit: 2018-12-27 | Discharge: 2018-12-27 | Disposition: A | Discharge status: Home / Self Care | Payer: PPO | Source: Ambulatory Visit | Attending: Adult Health | Admitting: Adult Health

## 2018-12-27 DIAGNOSIS — Z87891 Personal history of nicotine dependence: Secondary | ICD-10-CM | POA: Diagnosis not present

## 2018-12-27 DIAGNOSIS — S76811D Strain of other specified muscles, fascia and tendons at thigh level, right thigh, subsequent encounter: Secondary | ICD-10-CM | POA: Diagnosis not present

## 2018-12-27 DIAGNOSIS — R918 Other nonspecific abnormal finding of lung field: Secondary | ICD-10-CM

## 2019-01-03 ENCOUNTER — Other Ambulatory Visit: Payer: Self-pay

## 2019-01-03 DIAGNOSIS — S76811D Strain of other specified muscles, fascia and tendons at thigh level, right thigh, subsequent encounter: Secondary | ICD-10-CM | POA: Diagnosis not present

## 2019-01-03 DIAGNOSIS — G8929 Other chronic pain: Secondary | ICD-10-CM

## 2019-01-03 DIAGNOSIS — M542 Cervicalgia: Secondary | ICD-10-CM

## 2019-01-03 NOTE — Progress Notes (Signed)
Per [place], referral to PT modified to include neck and back pain.  Charyl Bigger, CMA

## 2019-01-10 DIAGNOSIS — M62838 Other muscle spasm: Secondary | ICD-10-CM | POA: Diagnosis not present

## 2019-01-10 DIAGNOSIS — Z1211 Encounter for screening for malignant neoplasm of colon: Secondary | ICD-10-CM | POA: Diagnosis not present

## 2019-01-10 DIAGNOSIS — M6289 Other specified disorders of muscle: Secondary | ICD-10-CM | POA: Diagnosis not present

## 2019-01-10 LAB — COLOGUARD: Cologuard: NEGATIVE

## 2019-01-17 DIAGNOSIS — S76811D Strain of other specified muscles, fascia and tendons at thigh level, right thigh, subsequent encounter: Secondary | ICD-10-CM | POA: Diagnosis not present

## 2019-01-24 DIAGNOSIS — M6289 Other specified disorders of muscle: Secondary | ICD-10-CM | POA: Diagnosis not present

## 2019-01-24 DIAGNOSIS — M62838 Other muscle spasm: Secondary | ICD-10-CM | POA: Diagnosis not present

## 2019-01-31 ENCOUNTER — Other Ambulatory Visit: Payer: PPO

## 2019-01-31 ENCOUNTER — Other Ambulatory Visit: Payer: Self-pay

## 2019-01-31 DIAGNOSIS — M6289 Other specified disorders of muscle: Secondary | ICD-10-CM | POA: Diagnosis not present

## 2019-01-31 DIAGNOSIS — M62838 Other muscle spasm: Secondary | ICD-10-CM | POA: Diagnosis not present

## 2019-01-31 DIAGNOSIS — E785 Hyperlipidemia, unspecified: Secondary | ICD-10-CM | POA: Diagnosis not present

## 2019-01-31 DIAGNOSIS — Z79899 Other long term (current) drug therapy: Secondary | ICD-10-CM

## 2019-02-01 ENCOUNTER — Encounter: Payer: Self-pay | Admitting: Adult Health

## 2019-02-01 LAB — HEPATIC FUNCTION PANEL
ALT: 26 IU/L (ref 0–44)
AST: 20 IU/L (ref 0–40)
Albumin: 4.7 g/dL (ref 3.8–4.8)
Alkaline Phosphatase: 48 IU/L (ref 39–117)
Bilirubin Total: 0.3 mg/dL (ref 0.0–1.2)
Bilirubin, Direct: 0.11 mg/dL (ref 0.00–0.40)
Total Protein: 6.6 g/dL (ref 6.0–8.5)

## 2019-02-07 ENCOUNTER — Ambulatory Visit: Payer: PPO | Admitting: Cardiology

## 2019-02-07 ENCOUNTER — Encounter: Payer: Self-pay | Admitting: Cardiology

## 2019-02-07 ENCOUNTER — Other Ambulatory Visit: Payer: Self-pay

## 2019-02-07 VITALS — BP 144/84 | HR 65 | Ht 69.0 in | Wt 173.2 lb

## 2019-02-07 DIAGNOSIS — Z7189 Other specified counseling: Secondary | ICD-10-CM | POA: Diagnosis not present

## 2019-02-07 DIAGNOSIS — I10 Essential (primary) hypertension: Secondary | ICD-10-CM

## 2019-02-07 DIAGNOSIS — E7800 Pure hypercholesterolemia, unspecified: Secondary | ICD-10-CM

## 2019-02-07 DIAGNOSIS — I2510 Atherosclerotic heart disease of native coronary artery without angina pectoris: Secondary | ICD-10-CM

## 2019-02-07 DIAGNOSIS — I2584 Coronary atherosclerosis due to calcified coronary lesion: Secondary | ICD-10-CM | POA: Diagnosis not present

## 2019-02-07 MED ORDER — ROSUVASTATIN CALCIUM 20 MG PO TABS: 20.0000 mg | ORAL_TABLET | Freq: Every day | ORAL | 3 refills | Status: DC

## 2019-02-07 NOTE — Progress Notes (Signed)
Cardiology Office Note:    Date:  02/07/2019   ID:  Ryan Beard, DOB April 16, 1951, MRN [id number]  PCP:  Esaw Grandchild, NP  Cardiologist:  Buford Dresser, MD PhD  Referring MD: Esaw Grandchild, NP   CC: follow up  History of Present Illness:    Ryan Beard is a 68 y.o. male with a hx of hyperlipidemia, distant chest pain who is seen for follow up. He was initially seen as a new consult at the request of Danford, Katy D, NP for the evaluation and management of cardiovascular risk on 01/29/2018.  Cardiovascular history and risk: About ~2012 he was having issues with chest pain. Had heart cath at that time (see below) without any obstructive CAD. He has made major lifestyle modifications since that time. No longer a smoker, completely changed diet, highly active. Does high exertion activities like P90X. Also has been spending significant time and research into nutrition and supplements to bolster his lifestyle.   Today: Doing well, walks 2 miles every day after dinner. Golfs weekly, about 6.5 miles of walking each time. Does P90X as well. No chest pain, no SOB.  Bp at home 135/75. Had to stop taking testosterone gel as it was worsening his prostate symptoms.  Taking lisinopril 5 mg daily, rosuvastatin 20 mg daily (was unclear if he was supposed to be taking half or whole pill, so was taking whole pill).   Denies chest pain, shortness of breath at rest or with normal exertion. No PND, orthopnea, LE edema or unexpected weight gain. No syncope or palpitations.  Past Medical History:  Diagnosis Date  . Abnormal PSA   . Hyperlipidemia   Prior heart cath in 2007, results below.  Past Surgical History:  Procedure Laterality Date  . APPENDECTOMY  1973  . CARDIAC CATHETERIZATION    . PROSTATE BIOPSY    . SEPTOPLASTY  1960  . TONSILLECTOMY  1960    Current Medications: Current Outpatient Medications on File Prior to Visit  Medication Sig  . Acetylcysteine  (N-ACETYL-L-CYSTEINE PO) Take 600 mg by mouth daily.  Marland Kitchen BLACK PEPPER-TURMERIC PO Take 600 mg by mouth daily.  . CHELATED MAGNESIUM PO Take 1 tablet by mouth daily.  . Cholecalciferol (VITAMIN D-3) 5000 UNITS TABS Take by mouth daily.  . Cyanocobalamin (B-12) 2500 MCG TABS Take by mouth daily.  Javier Docker Oil 500 MG CAPS Take 1 capsule by mouth daily.  Marland Kitchen L-Arginine 500 MG TABS Take 1 tablet by mouth daily.  Marland Kitchen lisinopril (ZESTRIL) 5 MG tablet TAKE 1 TABLET BY MOUTH DAILY  . Misc Natural Products (BETA-SITOSTEROL PLANT STEROLS) CAPS Take 160 mg by mouth daily.  . Prasterone, DHEA, (DHEA 50 PO) Take 1 tablet by mouth daily.  . Pregnenolone POWD 75 mg by Does not apply route daily.  Marland Kitchen Resveratrol 250 MG CAPS Take by mouth daily.  . rosuvastatin (CRESTOR) 20 MG tablet Take 0.5 tablets (10 mg total) by mouth daily.  . saw palmetto 160 MG capsule Take 160 mg by mouth 2 (two) times daily.  . tadalafil (CIALIS) 5 MG tablet Take 5 mg by mouth daily as needed for erectile dysfunction.  Marland Kitchen UNABLE TO FIND daily. Med Name: Compounded Testosterone gel 6.5%  . UNABLE TO FIND daily. Med Name: ZMA b6 magnesium and zinc  . UNABLE TO FIND Take 1 tablet by mouth daily. Med Name: Enhanced PQQ with 50 mg Ubiquinol  . UNABLE TO FIND Take 150 mg by mouth daily. Med Name: K2 MK7  .  UNABLE TO FIND Med Name: Olipure BP  . UNABLE TO FIND Take 12.5 mg by mouth daily. Med Name: I-Throid Iodine  . vitamin C (ASCORBIC ACID) 500 MG tablet Take 500 mg by mouth daily.  . vitamin E (VITAMIN E) 400 UNIT capsule Take 400 Units by mouth daily.   No current facility-administered medications on file prior to visit.      Allergies:   Patient has no known allergies.   Social History   Socioeconomic History  . Marital status: Single    Spouse name: Not on file  . Number of children: Not on file  . Years of education: Not on file  . Highest education level: Not on file  Occupational History  . Not on file  Social Needs  .  Financial resource strain: Not on file  . Food insecurity    Worry: Not on file    Inability: Not on file  . Transportation needs    Medical: Not on file    Non-medical: Not on file  Tobacco Use  . Smoking status: Former Smoker    Packs/day: 1.00    Years: 30.00    Pack years: 30.00    Quit date: 05/03/2005    Years since quitting: 13.7  . Smokeless tobacco: Never Used  Substance and Sexual Activity  . Alcohol use: Yes    Alcohol/week: 1.0 standard drinks    Types: 1 Glasses of wine per week  . Drug use: No  . Sexual activity: Yes    Birth control/protection: None, Post-menopausal  Lifestyle  . Physical activity    Days per week: Not on file    Minutes per session: Not on file  . Stress: Not on file  Relationships  . Social Herbalist on phone: Not on file    Gets together: Not on file    Attends religious service: Not on file    Active member of club or organization: Not on file    Attends meetings of clubs or organizations: Not on file    Relationship status: Not on file  Other Topics Concern  . Not on file  Social History Narrative  . Not on file   Retired Art gallery manager. Extremely active.  Family History: The patient's family history includes Alcohol abuse in his sister; Cancer in his father; Heart disease in his mother; Hypertension in his father. There is no history of Colon cancer, Esophageal cancer, Rectal cancer, Stomach cancer, or Prostate cancer.  ROS:   Please see the history of present illness.  Additional pertinent ROS: Constitutional: Negative for chills, fever, night sweats, unintentional weight loss  HENT: Negative for ear pain and hearing loss.   Eyes: Negative for loss of vision and eye pain.  Respiratory: Negative for cough, sputum, wheezing.   Cardiovascular: See HPI. Gastrointestinal: Negative for abdominal pain, melena, and hematochezia.  Genitourinary: Negative for dysuria and hematuria.  Musculoskeletal: Negative for falls and  myalgias.  Skin: Negative for itching and rash.  Neurological: Negative for focal weakness, focal sensory changes and loss of consciousness.  Endo/Heme/Allergies: Does not bruise/bleed easily.   EKGs/Labs/Other Studies Reviewed:    The following studies were reviewed today: Cath 09/2010  IMPRESSION:  1. Normal left ventricular function with suggestion of mild      angiographic mitral valve prolapse.  2. Mild luminal irregularity without significant obstructive stenoses      with 10-20% narrowings involving the proximal to mid left anterior      descending artery,  first diagonal branch of the left anterior      descending artery and right coronary artery.   RECOMMENDATIONS:  Medical therapy.  The patient will be discharged home  later today with plans for follow up with Dr. Gwenlyn Found.  EKG:  EKG is personally reviewed today.  The ekg ordered today demonstrates normal sinus rhythm   Recent Labs: 12/13/2018: BUN 12; Creatinine, Ser 0.82; Hemoglobin 14.3; Platelets 172; Potassium 4.5; Sodium 141; TSH 3.470 01/31/2019: ALT 26  Recent Lipid Panel    Component Value Date/Time   CHOL 177 12/13/2018 0905   TRIG 157 (H) 12/13/2018 0905   HDL 45 12/13/2018 0905   CHOLHDL 3.9 12/13/2018 0905   LDLCALC 101 (H) 12/13/2018 0905    Physical Exam:    VS:  BP (!) 144/84   Pulse 65   Ht 5\' 9"  (1.753 m)   Wt 173 lb 3.2 oz (78.6 kg)   SpO2 99%   BMI 25.58 kg/m     Wt Readings from Last 3 Encounters:  02/07/19 173 lb 3.2 oz (78.6 kg)  12/20/18 169 lb (76.7 kg)  06/21/18 177 lb 1.6 oz (80.3 kg)    GEN: Well nourished, well developed in no acute distress HEENT: Normal, moist mucous membranes NECK: No JVD CARDIAC: regular rhythm, normal S1 and S2, no rubs or gallops. No murmurs. VASCULAR: Radial and DP pulses 2+ bilaterally. No carotid bruits RESPIRATORY:  Clear to auscultation without rales, wheezing or rhonchi  ABDOMEN: Soft, non-tender, non-distended MUSCULOSKELETAL:  Ambulates  independently SKIN: Warm and dry, no edema NEUROLOGIC:  Alert and oriented x 3. No focal neuro deficits noted. PSYCHIATRIC:  Normal affect   ASSESSMENT:    1. Coronary artery calcification   2. Pure hypercholesterolemia   3. Cardiac risk counseling   4. Counseling on health promotion and disease prevention   5. Essential hypertension    PLAN:    CV risk factors: coronary artery calcification, hypercholesterolemia, hypertension: -prior cath 2012 with nonobstructive disease -HTN, lipid control as below -excellent lifestyle -on many supplements, has studied this at length. We have discussed that many of these have not been validated, and supplements are not regulated by the FDA.  Hypertension:  We discussed this at length at prior visit, discussed again today. He feels that it is well controlled at home, declines adjustment to medications -continue lisinopril 5 mg   Hyperlipidemia: -has been taking rosuvastatin 20 mg and tolerating. Goal LDL <100, near this (unclear if this is when he was on 10 or 20 mg at this time) -continue rosuvastatin 20 mg as he is tolerating -LDL goal <100 at least or ideally <70  The 10-year ASCVD risk score [name] DC Brooke Bonito., et al., 2013) is: 18.8%   Values used to calculate the score:     Age: 53 years     Sex: Male     Is Non-Hispanic African American: No     Diabetic: No     Tobacco smoker: No     Systolic Blood Pressure: 123456 mmHg     Is BP treated: Yes     HDL Cholesterol: 45 mg/dL     Total Cholesterol: 177 mg/dL   Prevention: -recommend heart healthy/Mediterranean diet, with whole grains, fruits, vegetable, fish, lean meats, nuts, and olive oil. Limit salt. -he is already doing very high level activity, encouraged this. -recommend avoidance of tobacco products. Avoid excess alcohol. -Additional risk factor control:  -Diabetes: A1c is 5.4  -Lipids: 12/2018 Tchol 177, TG 157, HDL 45, LDL  101  -Blood pressure control: as above  -Weight: at goal  weight  Plan for follow up: 2 years or sooner PRN  Medication Adjustments/Labs and Tests Ordered: Current medicines are reviewed at length with the patient today.  Concerns regarding medicines are outlined above.  Orders Placed This Encounter  Procedures  . EKG 12-Lead   Meds ordered this encounter  Medications  . rosuvastatin (CRESTOR) 20 MG tablet    Sig: Take 1 tablet (20 mg total) by mouth daily.    Dispense:  90 tablet    Refill:  3    Patient Instructions  Medication Instructions:  Your Physician recommend you continue on your current medication as directed.    If you need a refill on your cardiac medications before your next appointment, please call your pharmacy.   Lab work: None  Testing/Procedures: None  Follow-Up: At [facility], you and your health needs are our priority.  As part of our continuing mission to provide you with exceptional heart care, we have created designated Provider Care Teams.  These Care Teams include your primary Cardiologist (physician) and Advanced Practice Providers (APPs -  Physician Assistants and Nurse Practitioners) who all work together to provide you with the care you need, when you need it. You will need a follow up appointment in 2 years.  Please call our office 2 months in advance to schedule this appointment.  You may see Buford Dresser, MD or one of the following Advanced Practice Providers on your designated Care Team:   Rosaria Ferries, PA-C . Jory Sims, DNP, ANP        Signed, Buford Dresser, MD PhD 02/07/2019 1:43 PM    [place]

## 2019-02-07 NOTE — Patient Instructions (Signed)
Medication Instructions:  Your Physician recommend you continue on your current medication as directed.    If you need a refill on your cardiac medications before your next appointment, please call your pharmacy.   Lab work: None  Testing/Procedures: None  Follow-Up: At [facility], you and your health needs are our priority.  As part of our continuing mission to provide you with exceptional heart care, we have created designated Provider Care Teams.  These Care Teams include your primary Cardiologist (physician) and Advanced Practice Providers (APPs -  Physician Assistants and Nurse Practitioners) who all work together to provide you with the care you need, when you need it. You will need a follow up appointment in 2 years.  Please call our office 2 months in advance to schedule this appointment.  You may see Buford Dresser, MD or one of the following Advanced Practice Providers on your designated Care Team:   Rosaria Ferries, PA-C . Jory Sims, DNP, ANP

## 2019-02-09 DIAGNOSIS — M6289 Other specified disorders of muscle: Secondary | ICD-10-CM | POA: Diagnosis not present

## 2019-02-09 DIAGNOSIS — M62838 Other muscle spasm: Secondary | ICD-10-CM | POA: Diagnosis not present

## 2019-02-14 DIAGNOSIS — S76811D Strain of other specified muscles, fascia and tendons at thigh level, right thigh, subsequent encounter: Secondary | ICD-10-CM | POA: Diagnosis not present

## 2019-02-22 ENCOUNTER — Encounter: Payer: Self-pay | Admitting: Cardiology

## 2019-05-17 ENCOUNTER — Ambulatory Visit (INDEPENDENT_AMBULATORY_CARE_PROVIDER_SITE_OTHER): Payer: PPO

## 2019-05-17 ENCOUNTER — Ambulatory Visit (INDEPENDENT_AMBULATORY_CARE_PROVIDER_SITE_OTHER): Payer: PPO | Admitting: Podiatry

## 2019-05-17 ENCOUNTER — Encounter: Payer: Self-pay | Admitting: Podiatry

## 2019-05-17 ENCOUNTER — Other Ambulatory Visit: Payer: Self-pay

## 2019-05-17 DIAGNOSIS — M79671 Pain in right foot: Secondary | ICD-10-CM

## 2019-05-17 DIAGNOSIS — M779 Enthesopathy, unspecified: Secondary | ICD-10-CM

## 2019-05-17 DIAGNOSIS — M79672 Pain in left foot: Secondary | ICD-10-CM

## 2019-05-17 DIAGNOSIS — M7752 Other enthesopathy of left foot: Secondary | ICD-10-CM

## 2019-05-17 NOTE — Progress Notes (Signed)
Subjective:   Patient ID: Ryan Beard, male   DOB: 70 y.o.   MRN: [id number]   HPI Patient presents stating he is got a lot of pain in his left joint and states that it seems like it is been getting gradually worse over the last couple months.  He is very active plays golf likes to walk and patient does not smoke currently   Review of Systems  All other systems reviewed and are negative.       Objective:  Physical Exam Vitals and nursing note reviewed.  Constitutional:      Appearance: He is well-developed.  Pulmonary:     Effort: Pulmonary effort is normal.  Musculoskeletal:        General: Normal range of motion.  Skin:    General: Skin is warm.  Neurological:     Mental Status: He is alert.     Neurovascular status found to be intact muscle strength found to be adequate range of motion within normal limits.  Patient is found to have inflammation pain around the second metatarsal phalangeal joint left with fluid buildup around the joint surface and pain with palpation.  Patient is found to have good digital perfusion is well oriented x3     Assessment:  Inflammatory capsulitis second MPJ left with fluid buildup around the joint     Plan:  H&P condition reviewed and I do think this should be treated conservatively.  I did go ahead did a proximal nerve block aspirated the joint getting on small amount of clear fluid injected quarter cc dexamethasone Kenalog and applied thick plantar pad to reduce pressure on the joint surface and I want a recheck again in 3 weeks.  X-rays were negative for signs of fracture or indications of arthritic condition

## 2019-05-18 ENCOUNTER — Other Ambulatory Visit: Payer: Self-pay | Admitting: Podiatry

## 2019-05-18 DIAGNOSIS — M779 Enthesopathy, unspecified: Secondary | ICD-10-CM

## 2019-05-26 IMAGING — US US ABDOMINAL AORTA SCREENING AAA
1 series · 14 of 23 positions shown · non-contrast
Comparison: None.

CLINICAL DATA: Male between 65-75 years of age with a smoking
history.

EXAM:
US ABDOMINAL AORTA MEDICARE SCREENING
TECHNIQUE: Ultrasound examination of the abdominal aorta was performed as a
screening evaluation for abdominal aortic aneurysm.

[Series 1: us abdominal aorta screening aaa · 0.26mm/px · 14 of 23 slices shown]
[im 1/23]
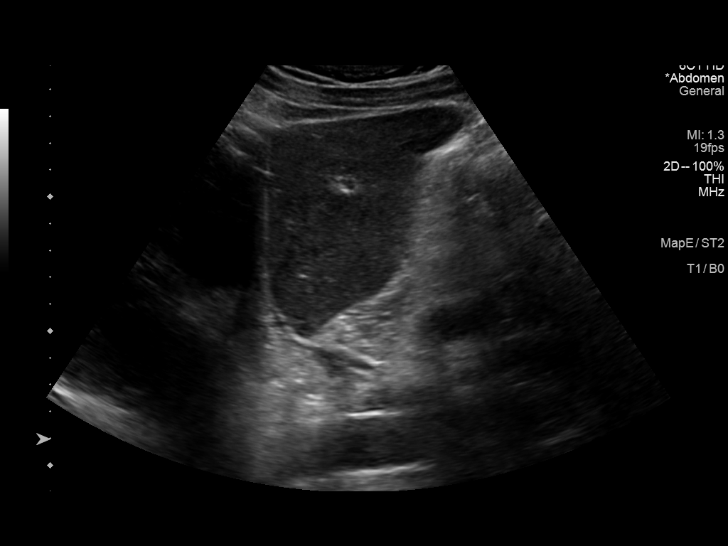
[im 3/23]
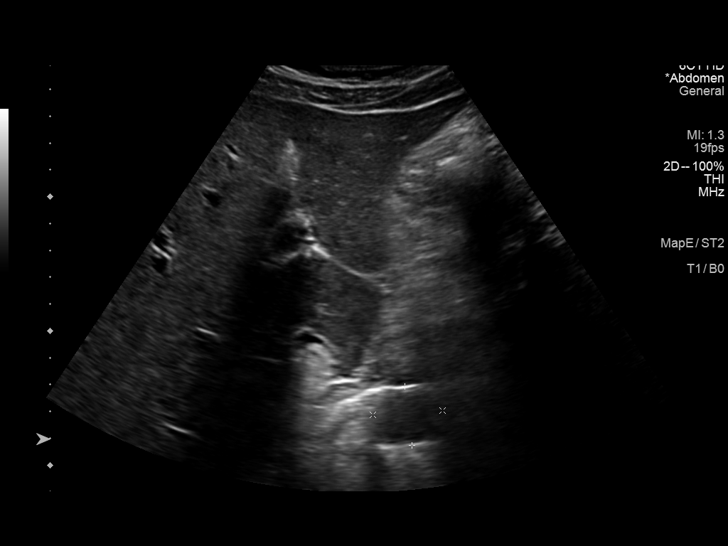
[im 5/23]
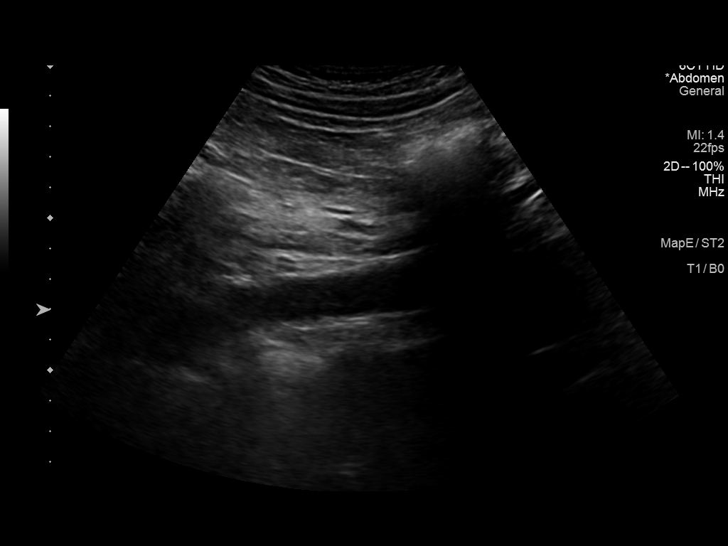
[im 6/23]
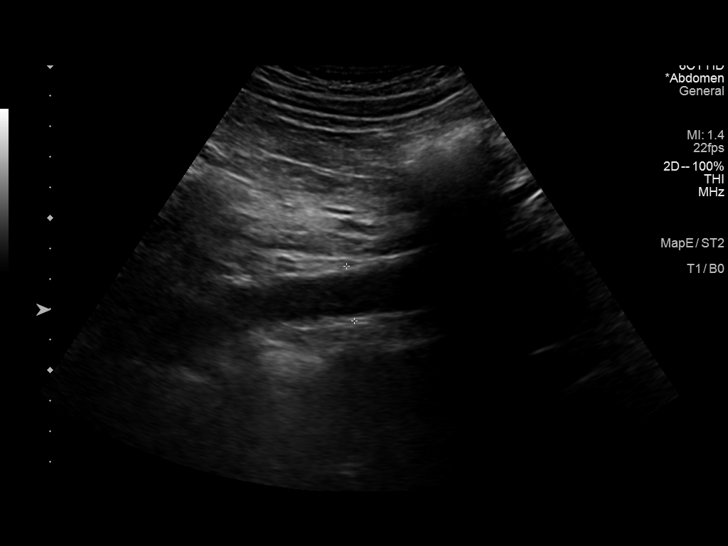
[im 8/23]
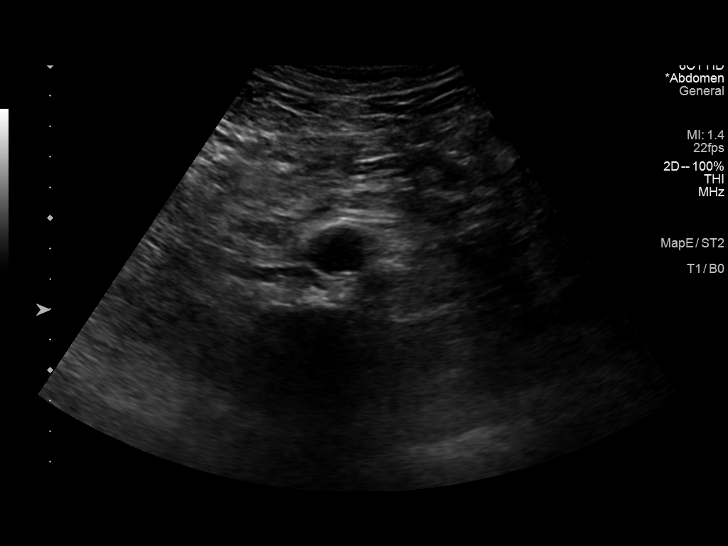
[im 10/23]
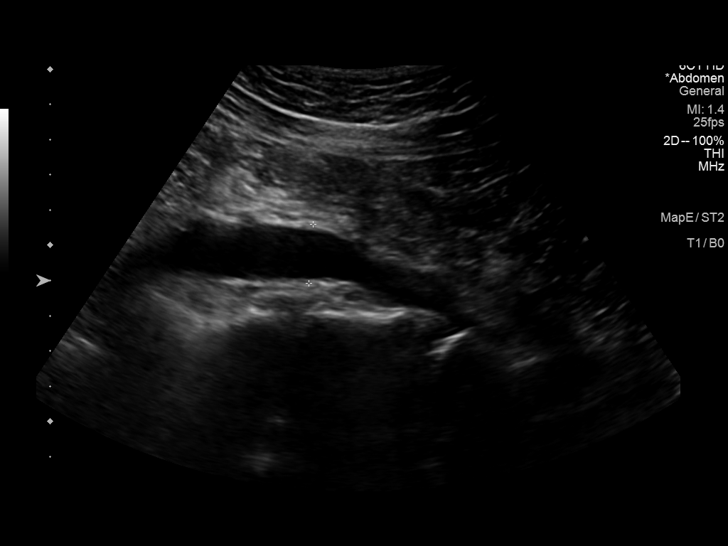
[im 11/23]
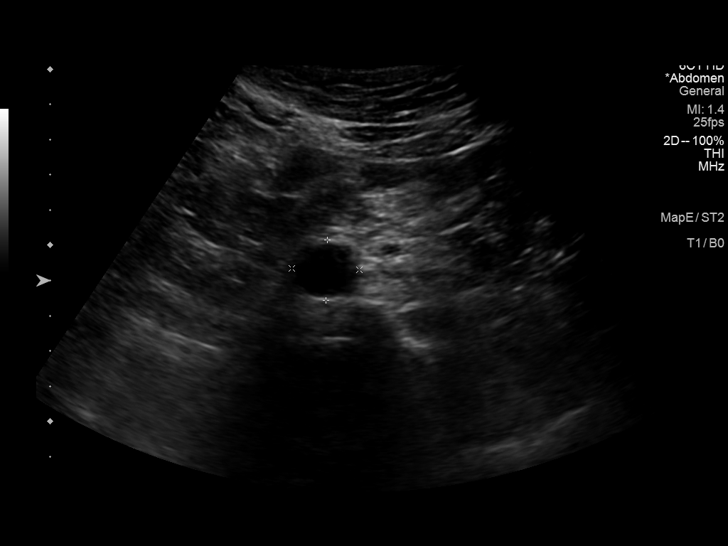
[im 13/23]
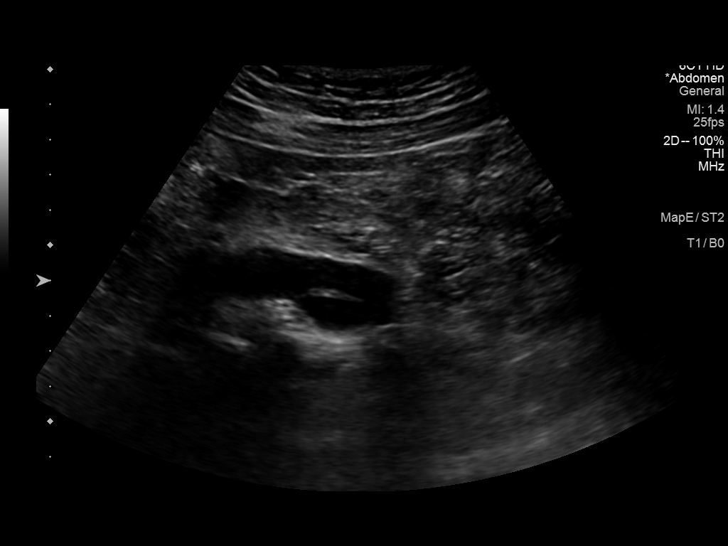
[im 14/23]
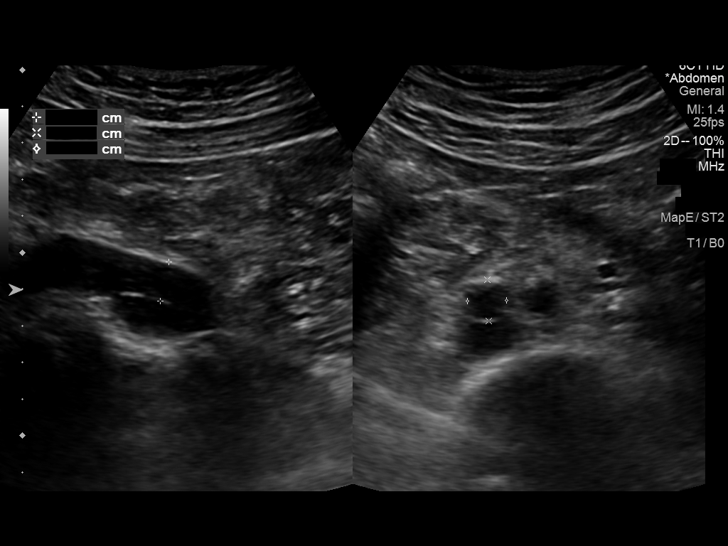
[im 16/23]
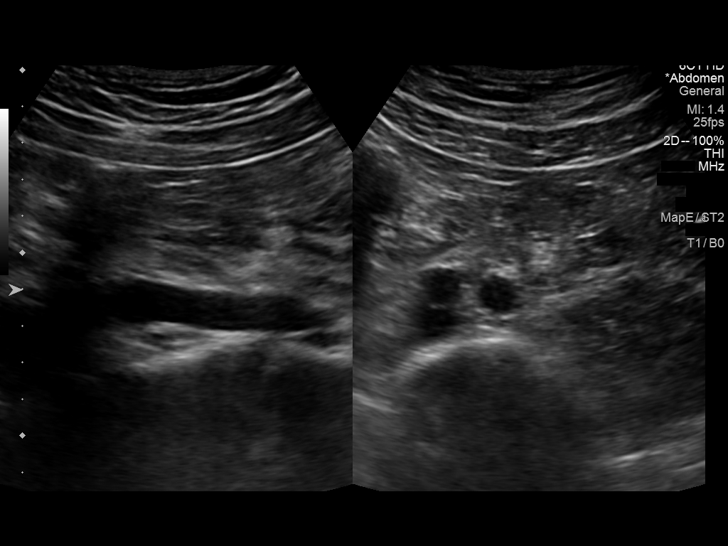
[im 18/23]
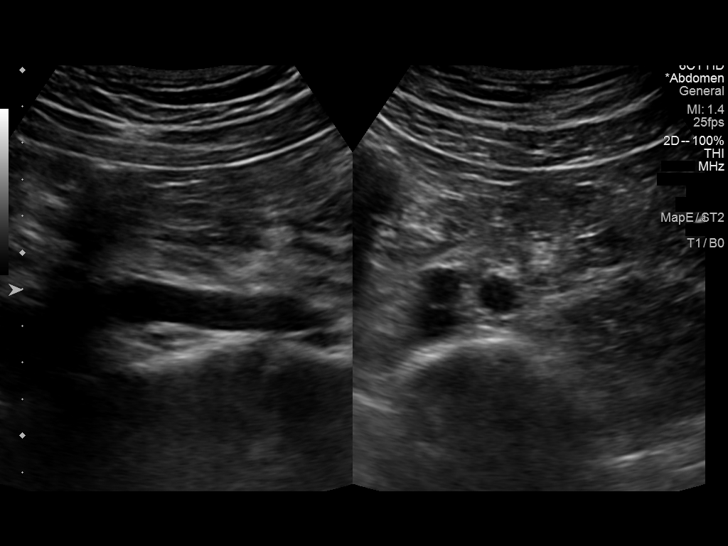
[im 19/23]
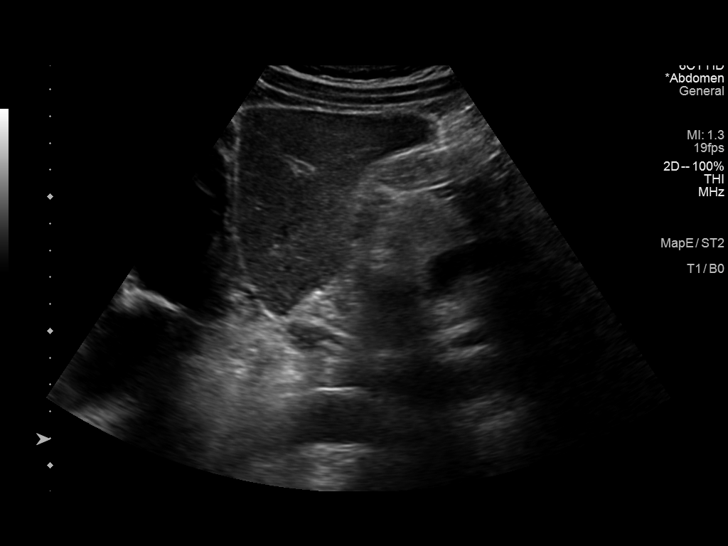
[im 21/23]
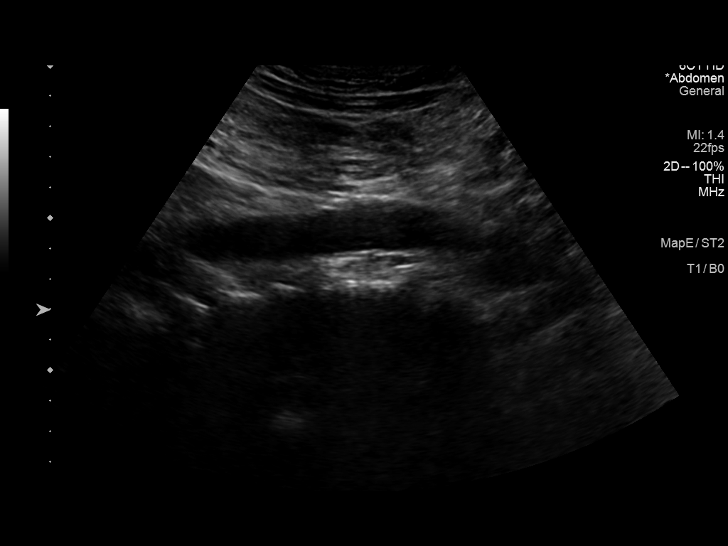
[im 23/23]
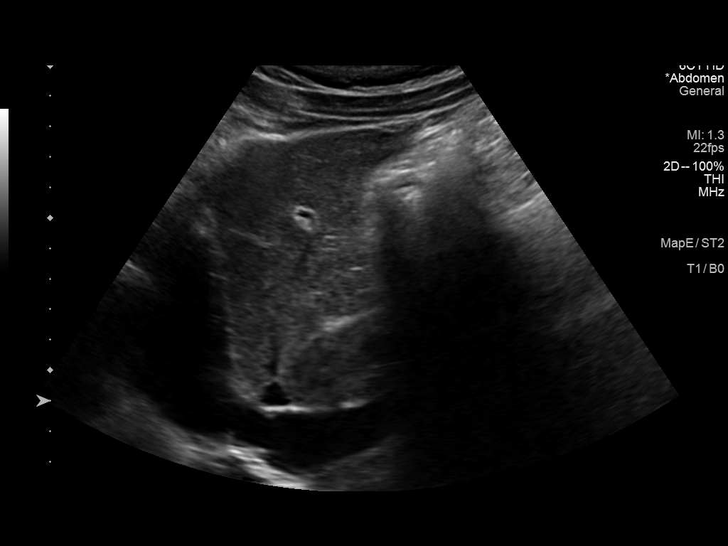

[14 of 23 positions shown; findings below may reference images not displayed]

FINDINGS: Abdominal aortic measurements as follows:

Proximal:  2.3 cm

Mid:  1.8 cm

Distal:  0.7 cm
IMPRESSION: No evidence of abdominal aortic aneurysm.

## 2019-05-28 ENCOUNTER — Other Ambulatory Visit: Payer: Self-pay | Admitting: Adult Health

## 2019-05-31 ENCOUNTER — Ambulatory Visit: Payer: PPO | Admitting: Podiatry

## 2019-05-31 ENCOUNTER — Encounter: Payer: Self-pay | Admitting: Podiatry

## 2019-05-31 ENCOUNTER — Other Ambulatory Visit: Payer: Self-pay

## 2019-05-31 VITALS — Temp 96.1°F

## 2019-05-31 DIAGNOSIS — M21619 Bunion of unspecified foot: Secondary | ICD-10-CM | POA: Diagnosis not present

## 2019-05-31 DIAGNOSIS — M779 Enthesopathy, unspecified: Secondary | ICD-10-CM

## 2019-06-01 NOTE — Progress Notes (Signed)
Subjective:   Patient ID: Ryan Beard, male   DOB: 69 y.o.   MRN: [id number]   HPI Patient presents stating the left foot feels much better with pain only upon deep palpation and states that padding and shoe gear helping and he has questions concerning future   ROS      Objective:  Physical Exam  Neurovascular status intact with patient's left second MPJ showing improved results with diminished discomfort with padding still being utilized at the time     Assessment:  Laboratory capsulitis second MPJ improved with treatment F2     Plan:  H&P condition reviewed and at this point I recommended continued padding and shoe gear modifications.  I discussed oral anti-inflammatories to take and what to do if any increased discomfort were to occur we also discussed possible surgical shortening of the metatarsal in future

## 2019-06-07 DIAGNOSIS — N401 Enlarged prostate with lower urinary tract symptoms: Secondary | ICD-10-CM | POA: Diagnosis not present

## 2019-06-07 DIAGNOSIS — Z79899 Other long term (current) drug therapy: Secondary | ICD-10-CM | POA: Diagnosis not present

## 2019-06-07 DIAGNOSIS — R972 Elevated prostate specific antigen [PSA]: Secondary | ICD-10-CM | POA: Diagnosis not present

## 2019-06-07 DIAGNOSIS — E291 Testicular hypofunction: Secondary | ICD-10-CM | POA: Diagnosis not present

## 2019-06-07 LAB — LIPID PANEL
Cholesterol: 176 (ref 0–200)
HDL: 45 (ref 35–70)
LDL Cholesterol: 102
Triglycerides: 166 — AB (ref 40–160)

## 2019-06-07 LAB — CBC AND DIFFERENTIAL
HCT: 45 (ref 41–53)
Hemoglobin: 15 (ref 13.5–17.5)
Neutrophils Absolute: 4
Platelets: 168 (ref 150–399)
WBC: 6

## 2019-06-07 LAB — BASIC METABOLIC PANEL
BUN: 14 (ref 4–21)
CO2: 24 — AB (ref 13–22)
Chloride: 100 (ref 99–108)
Creatinine: 1 (ref <=1.3)
Glucose: 97
Potassium: 4.4 (ref 3.4–5.3)
Sodium: 139 (ref 137–147)

## 2019-06-07 LAB — COMPREHENSIVE METABOLIC PANEL
Albumin: 4.6 (ref 3.5–5.0)
Calcium: 9.6 (ref 8.7–10.7)
GFR calc Af Amer: 91
GFR calc non Af Amer: 79
Globulin: 2.2

## 2019-06-07 LAB — HEPATIC FUNCTION PANEL
ALT: 27 (ref 10–40)
AST: 19 (ref 14–40)
Alkaline Phosphatase: 43 (ref 25–125)
Bilirubin, Total: 0.4

## 2019-06-07 LAB — PSA: PSA: 11

## 2019-06-07 LAB — DHEA: DHEA: 507

## 2019-06-07 LAB — ESTRADIOL: Estradiol: 16.4

## 2019-06-07 LAB — CBC: RBC: 4.96 (ref 3.87–5.11)

## 2019-06-11 ENCOUNTER — Other Ambulatory Visit: Payer: Self-pay

## 2019-06-11 ENCOUNTER — Ambulatory Visit (INDEPENDENT_AMBULATORY_CARE_PROVIDER_SITE_OTHER): Payer: PPO | Admitting: Adult Health

## 2019-06-11 ENCOUNTER — Encounter: Payer: Self-pay | Admitting: Adult Health

## 2019-06-11 DIAGNOSIS — R972 Elevated prostate specific antigen [PSA]: Secondary | ICD-10-CM | POA: Diagnosis not present

## 2019-06-11 DIAGNOSIS — Z0000 Encounter for general adult medical examination without abnormal findings: Secondary | ICD-10-CM

## 2019-06-11 DIAGNOSIS — I10 Essential (primary) hypertension: Secondary | ICD-10-CM

## 2019-06-11 DIAGNOSIS — E7800 Pure hypercholesterolemia, unspecified: Secondary | ICD-10-CM | POA: Diagnosis not present

## 2019-06-11 NOTE — Assessment & Plan Note (Signed)
Continue all medications as directed. Remain well hydrated, follow Mediterranean Diet. Recent labs from Urologist-stable. Continue to social distance and wear a mask when in public. Basic information for mRNA COVID-19 vaccines Please note that these CDC guidelines can change from week to week, and even day to day.  Refer to the [facility] website for the most current recommendations for any given day at [email or web address]   1) Under the EUAs (emergency use authorization), the following age groups are authorized to receive the COVID-19 vaccination: . Pfizer-BioNTech: ages ?16 years . Moderna: ages ?18 years . Children and adolescents outside of these authorized age groups should not receive COVID-19 vaccination at this time   2) You are not eligible to receive a COVID-19 vaccine if: . You have received another vaccine in the last 14-days (example: flu shot, shingles, tetanus, etc.) . You currently have a positive test for COVID-19. The vaccine must be deferred until you have recovered from the acute illness and the criteria has been met for you to discontinue isolation. . You have received passive antibody therapy (monoclonal antibodies or convalescent serum) as treatment for COVID-19 within the past 90 days   3) Currently COVID-19 vaccines are contraindicated in patients who have had an anaphylactic reaction to polyethylene glycol ( ie. GoLYTELY bowel prep or MiraLAX ) or polysorbate products (often used as a thickening agent for many ice creams, frozen custard and whipped dessert products etc).   Also, if you had an anaphylactic reaction to your first COVID-19 vaccination, you cannot receive your second.     - Also if you are currently pregnant please check with your obstetrician to see what their recommendations are regarding vaccination.   - Also, if you have a chronic condition such as CLL, RA or other immunocompromising conditions, please know that no data are currently  available (or very limited data) on the safety and efficacy of mRNA COVID-19 vaccines in persons with autoimmune/ immunocompromising conditions.   Please seek the advice of your specialist regarding whether or not you should receive your COVID-19 vaccination.   Follow-up with primary care in 6 months for physical with fasting labs.

## 2019-06-11 NOTE — Progress Notes (Signed)
Subjective:    Patient ID: Ryan Beard, male    DOB: 05-Nov-1950, 69 y.o.   MRN: [id number]  HPI: 12/20/2018 OV: Ryan Beard is here for CPE He reports medication compliance, denies SE He remains quite active  He reports ambulatory BP  SBP 130-140s DBP 60-70s He has finally switched to taking BP on upper arm BP cuff- not using wrist cuff anymore He is not agreeable increase in ACE dosage  Followed by Urology for low T and abnormal PSA 12/07/2018 PSA- 10.8, reduction from 18.1  12/14/2018 Labs- My Notes-  TSH-WNL, 3.470  A1c-WNL, 5.4  CBC-stable  CMP-stable  The 10-year ASCVD risk score Ryan Beard DC Jr., et al., 2013) is: 21%  Values used to calculate the score:   Age: 22 years   Sex: Male   Is Non-Hispanic African American: No   Diabetic: No   Tobacco smoker: No   Systolic Blood Pressure: 546 mmHg   Is BP treated: Yes   HDL Cholesterol: 45 mg/dL   Total Cholesterol: 177 mg/dL  LDL-102  Currently on Rosuvastatin 53m, needs to be increased   01/2018- 1. Hypertension: discussed risk of this at length. He is certain that his wearable tracker is accurate, and he also used wrist cuffs at home that mimic his wearable. His BP is very elevated in both arms today, and his wrist measurement is off by nearly 40 points systolic from both the automatic and manual cuffs in our office. He actually wanted to stop lisinopril entirely. I offered changing to amlodipine or chlorthalidone, but he would rather stick with the lisinopril. I think we should go up on the dose, but he is worried that because his wearable is already reading 110s-120s, going up on the dose would bottom him out. I encouraged him to get a more reliable cuff, such as an upper arm cuff, and bring it to his next PCP appointment to calibrate it. He will consider. Declined adjustment of lisinopril today.  2. Primary prevention of cardiovascular events: no history of MI/CVA. Nonobstructive coronary  plaque on prior cath. However, does have significant coronary and atherosclerotic calcification on his CT chest. This was not a dedicated scan, so calcium score not calculated, but presence of CAC supports need for more aggressive prevention. The 10-year ASCVD risk score (Ryan BussingDC JBrooke Beard, et al., 2013) is: 18.1% Values used to calculate the score: Age: [age]years Sex: Male Is Non-Hispanic African American: No Diabetic: No Tobacco smoker: No Systolic Blood Pressure: 1270mmHg Is BP treated: No HDL Cholesterol: 47 mg/dL Total Cholesterol: 184 mg/dL On rosuvastatin5 mg. Would consider increasing the dose to get his LDL less than 100, ideally closer to 70 with CAC and prior nonobstructive coronary plaque. Declined adjustment today.  Prevention: -recommend heart healthy/Mediterranean diet, with whole grains, fruits, vegetable, fish, lean meats, nuts, and olive oil. Limit salt. -he is already doing very high level activity, encouraged this. -recommend avoidance of tobacco products. Avoid excess alcohol. -Additional risk factor control: -Diabetes: A1c is 5.4 -Lipids: Tchol 184, TG 140, HDL 47, LDL 109 -Blood pressure control: as above -Weight: at goal weight  06/11/2019 OV: Ryan Beard here for regular f/u:HTN, HLD He reports medication compliance, denies SE. He reports ambulatory BP SBP 120-130 DBP 70s He is using upper arm cuff- will alternate arms. He denies acute cardiac sx's. He denies tobacco/vape use He enjoys one glass of red wine per night. He continues to exercise at high intensity daily- P90x, yoga, yard work, gOffice manager He had  full set of labs completed at Urology office- reviewed at length today 06/07/2019 Lipid- Total-176 TGs-166 HDL-45 LDL-102 Currently on Rosuvastatin 3m QD  PSA 11- he has stopped Testosterone replacement.  CMP-stable CBC-stable  02/2019 Last Cards OV- f/u in 2  yrs, PRN  Patient Care Team    Relationship Specialty Notifications Start End  Ryan MarbleD, NP PCP - General Family Medicine  08/16/17   CBuford Dresser MD PCP - Cardiology Cardiology Admissions 01/30/18     Patient Active Problem List   Diagnosis Date Noted  . Coronary artery calcification 02/07/2019  . Essential hypertension 12/21/2017  . Abnormal PSA 08/17/2017  . Hyperlipidemia 08/17/2017  . Transient elevated blood pressure 08/17/2017  . Healthcare maintenance 08/17/2017  . Family history of diabetes mellitus in sister 08/17/2017     Past Medical History:  Diagnosis Date  . Abnormal PSA   . Hyperlipidemia      Past Surgical History:  Procedure Laterality Date  . APPENDECTOMY  1973  . CARDIAC CATHETERIZATION    . PROSTATE BIOPSY    . SEPTOPLASTY  1960  . TONSILLECTOMY  1960     Family History  Problem Relation Age of Onset  . Heart disease Mother   . Cancer Father        lymphoma  . Hypertension Father   . Alcohol abuse Sister   . Colon cancer Neg Hx   . Esophageal cancer Neg Hx   . Rectal cancer Neg Hx   . Stomach cancer Neg Hx   . Prostate cancer Neg Hx      Social History   Substance and Sexual Activity  Drug Use No     Social History   Substance and Sexual Activity  Alcohol Use Yes  . Alcohol/week: 1.0 standard drinks  . Types: 1 Glasses of wine per week     Social History   Tobacco Use  Smoking Status Former Smoker  . Packs/day: 1.00  . Years: 30.00  . Pack years: 30.00  . Quit date: 05/03/2005  . Years since quitting: 14.1  Smokeless Tobacco Never Used     Outpatient Encounter Medications as of 06/11/2019  Medication Sig  . Acetylcysteine (N-ACETYL-L-CYSTEINE PO) Take 600 mg by mouth daily.  .Marland KitchenBLACK PEPPER-TURMERIC PO Take 600 mg by mouth daily.  . CHELATED MAGNESIUM PO Take 1 tablet by mouth daily.  . Cholecalciferol (VITAMIN D-3) 5000 UNITS TABS Take by mouth daily.  . Cyanocobalamin (B-12) 2500 MCG TABS Take by  mouth daily.  .Ryan DockerOil 500 MG CAPS Take 1 capsule by mouth daily.  .Marland KitchenL-Arginine 500 MG TABS Take 1 tablet by mouth daily.  .Marland Kitchenlisinopril (ZESTRIL) 5 MG tablet TAKE 1 TABLET BY MOUTH DAILY  . Misc Natural Products (BETA-SITOSTEROL PLANT STEROLS) CAPS Take 160 mg by mouth daily.  . Prasterone, DHEA, (DHEA 50 PO) Take 1 tablet by mouth daily.  . Pregnenolone POWD 75 mg by Does not apply route daily.  .Marland KitchenResveratrol 250 MG CAPS Take by mouth daily.  . rosuvastatin (CRESTOR) 20 MG tablet Take 1 tablet (20 mg total) by mouth daily.  . saw palmetto 160 MG capsule Take 160 mg by mouth 2 (two) times daily.  . tadalafil (CIALIS) 5 MG tablet Take 5 mg by mouth daily as needed for erectile dysfunction.  .Marland KitchenUNABLE TO FIND daily. Med Name: ZMA b6 magnesium and zinc  . UNABLE TO FIND Take 1 tablet by mouth daily. Med Name: Enhanced PQQ with 50 mg Ubiquinol  .  UNABLE TO FIND Take 150 mg by mouth daily. Med Name: K2 MK7  . UNABLE TO FIND Med Name: Olipure BP  . UNABLE TO FIND Take 12.5 mg by mouth daily. Med Name: I-Throid Iodine  . vitamin C (ASCORBIC ACID) 500 MG tablet Take 500 mg by mouth daily.  . vitamin E (VITAMIN E) 400 UNIT capsule Take 400 Units by mouth daily.  . [DISCONTINUED] UNABLE TO FIND daily. Med Name: Compounded Testosterone gel 6.5%   No facility-administered encounter medications on file as of 06/11/2019.    Allergies: Patient has no known allergies.  Body mass index is 26.12 kg/m.  Blood pressure (!) 147/71, pulse 67, temperature 97.8 F (36.6 C), temperature source Oral, height _0  (1.753 m), weight 176 lb 14.4 oz (80.2 kg), SpO2 100 %.     Review of Systems  Constitutional: Positive for fatigue. Negative for activity change, appetite change, chills, diaphoresis, fever and unexpected weight change.  HENT: Negative for congestion.   Eyes: Negative for visual disturbance.  Respiratory: Negative for chest tightness, shortness of breath, wheezing and stridor.    Cardiovascular: Negative for chest pain, palpitations and leg swelling.  Gastrointestinal: Negative for abdominal distention, anal bleeding, blood in stool, constipation, nausea and vomiting.  Endocrine: Negative for polydipsia, polyphagia and polyuria.  Genitourinary: Negative for difficulty urinating and flank pain.  Musculoskeletal: Negative for arthralgias and myalgias.  Neurological: Negative for dizziness.  Hematological: Negative for adenopathy. Does not bruise/bleed easily.  Psychiatric/Behavioral: Negative for agitation, confusion, decreased concentration, dysphoric mood, hallucinations, self-injury, sleep disturbance and suicidal ideas. The patient is not nervous/anxious and is not hyperactive.        Objective:   Physical Exam Vitals and nursing note reviewed.  Constitutional:      General: He is not in acute distress.    Appearance: Normal appearance. He is normal weight. He is not ill-appearing, toxic-appearing or diaphoretic.  HENT:     Head: Normocephalic and atraumatic.  Eyes:     Extraocular Movements: Extraocular movements intact.     Conjunctiva/sclera: Conjunctivae normal.     Pupils: Pupils are equal, round, and reactive to light.  Cardiovascular:     Rate and Rhythm: Normal rate and regular rhythm.     Pulses: Normal pulses.     Heart sounds: Normal heart sounds. No murmur. No friction rub. No gallop.   Pulmonary:     Effort: Pulmonary effort is normal. No respiratory distress.     Breath sounds: Normal breath sounds. No stridor. No wheezing, rhonchi or rales.  Chest:     Chest wall: No tenderness.  Musculoskeletal:        General: Normal range of motion.  Skin:    General: Skin is warm and dry.     Capillary Refill: Capillary refill takes less than 2 seconds.  Neurological:     Mental Status: He is alert and oriented to person, place, and time.     Coordination: Coordination normal.  Psychiatric:        Mood and Affect: Mood normal.        Behavior:  Behavior normal.        Thought Content: Thought content normal.        Judgment: Judgment normal.       Assessment & Plan:   1. Healthcare maintenance   2. Essential hypertension   3. Abnormal PSA   4. Pure hypercholesterolemia     Healthcare maintenance Continue all medications as directed. Remain well hydrated, follow Mediterranean Diet.  Recent labs from Urologist-stable. Continue to social distance and wear a mask when in public. Basic information for mRNA COVID-19 vaccines Please note that these CDC guidelines can change from week to week, and even day to day.  Refer to the [facility] website for the most current recommendations for any given day at GeekRegister.com.ee   1) Under the EUAs (emergency use authorization), the following age groups are authorized to receive the COVID-19 vaccination: . Pfizer-BioNTech: ages ?16 years . Moderna: ages ?18 years . Children and adolescents outside of these authorized age groups should not receive COVID-19 vaccination at this time   2) You are not eligible to receive a COVID-19 vaccine if: . You have received another vaccine in the last 14-days (example: flu shot, shingles, tetanus, etc.) . You currently have a positive test for COVID-19. The vaccine must be deferred until you have recovered from the acute illness and the criteria has been met for you to discontinue isolation. . You have received passive antibody therapy (monoclonal antibodies or convalescent serum) as treatment for COVID-19 within the past 90 days   3) Currently COVID-19 vaccines are contraindicated in patients who have had an anaphylactic reaction to polyethylene glycol ( ie. GoLYTELY bowel prep or MiraLAX ) or polysorbate products (often used as a thickening agent for many ice creams, frozen custard and whipped dessert products etc).   Also, if you had an anaphylactic reaction to your first COVID-19 vaccination, you cannot receive your second.     - Also  if you are currently pregnant please check with your obstetrician to see what their recommendations are regarding vaccination.   - Also, if you have a chronic condition such as CLL, RA or other immunocompromising conditions, please know that no data are currently available (or very limited data) on the safety and efficacy of mRNA COVID-19 vaccines in persons with autoimmune/ immunocompromising conditions.   Please seek the advice of your specialist regarding whether or not you should receive your COVID-19 vaccination.   Follow-up with primary care in 6 months for physical with fasting labs.  Essential hypertension He reports ambulatory BP SBP 120-130 DBP 70s He is using upper arm cuff- will alternate arms.  Continue Lisinopril 14mQD Followed by cards Q2Y  Abnormal PSA 06/2019 PSA 11- he has stopped Testosterone replacement.  Hyperlipidemia 06/07/2019 Lipid- Total-176 TGs-166 HDL-45 LDL-102 Currently on Rosuvastatin 286mQD    FOLLOW-UP:  Return in about 6 months (around 12/09/2019) for CPE, Fasting Labs.

## 2019-06-11 NOTE — Assessment & Plan Note (Signed)
06/07/2019 Lipid- Total-176 TGs-166 HDL-45 LDL-102 Currently on Rosuvastatin 20mg  QD

## 2019-06-11 NOTE — Patient Instructions (Signed)
Mediterranean Diet A Mediterranean diet refers to food and lifestyle choices that are based on the traditions of countries located on the [employer]. This way of eating has been shown to help prevent certain conditions and improve outcomes for people who have chronic diseases, like kidney disease and heart disease. What are tips for following this plan? Lifestyle  Cook and eat meals together with your family, when possible.  Drink enough fluid to keep your urine clear or pale yellow.  Be physically active every day. This includes: ? Aerobic exercise like running or swimming. ? Leisure activities like gardening, walking, or housework.  Get 7-8 hours of sleep each night.  If recommended by your health care provider, drink red wine in moderation. This means 1 glass a day for nonpregnant women and 2 glasses a day for men. A glass of wine equals 5 oz (150 mL). Reading food labels   Check the serving size of packaged foods. For foods such as rice and pasta, the serving size refers to the amount of cooked product, not dry.  Check the total fat in packaged foods. Avoid foods that have saturated fat or trans fats.  Check the ingredients list for added sugars, such as corn syrup. Shopping  At the grocery store, buy most of your food from the areas near the walls of the store. This includes: ? Fresh fruits and vegetables (produce). ? Grains, beans, nuts, and seeds. Some of these may be available in unpackaged forms or large amounts (in bulk). ? Fresh seafood. ? Poultry and eggs. ? Low-fat dairy products.  Buy whole ingredients instead of prepackaged foods.  Buy fresh fruits and vegetables in-season from local farmers markets.  Buy frozen fruits and vegetables in resealable bags.  If you do not have access to quality fresh seafood, buy precooked frozen shrimp or canned fish, such as tuna, salmon, or sardines.  Buy small amounts of raw or cooked vegetables, salads, or olives from  the deli or salad bar at your store.  Stock your pantry so you always have certain foods on hand, such as olive oil, canned tuna, canned tomatoes, rice, pasta, and beans. Cooking  Cook foods with extra-virgin olive oil instead of using butter or other vegetable oils.  Have meat as a side dish, and have vegetables or grains as your main dish. This means having meat in small portions or adding small amounts of meat to foods like pasta or stew.  Use beans or vegetables instead of meat in common dishes like chili or lasagna.  Experiment with different cooking methods. Try roasting or broiling vegetables instead of steaming or sauteing them.  Add frozen vegetables to soups, stews, pasta, or rice.  Add nuts or seeds for added healthy fat at each meal. You can add these to yogurt, salads, or vegetable dishes.  Marinate fish or vegetables using olive oil, lemon juice, garlic, and fresh herbs. Meal planning   Plan to eat 1 vegetarian meal one day each week. Try to work up to 2 vegetarian meals, if possible.  Eat seafood 2 or more times a week.  Have healthy snacks readily available, such as: ? Vegetable sticks with hummus. ? [place] yogurt. ? Fruit and nut trail mix.  Eat balanced meals throughout the week. This includes: ? Fruit: 2-3 servings a day ? Vegetables: 4-5 servings a day ? Low-fat dairy: 2 servings a day ? Fish, poultry, or lean meat: 1 serving a day ? Beans and legumes: 2 or more servings a week ?  Nuts and seeds: 1-2 servings a day ? Whole grains: 6-8 servings a day ? Extra-virgin olive oil: 3-4 servings a day  Limit red meat and sweets to only a few servings a month What are my food choices?  Mediterranean diet ? Recommended  Grains: Whole-grain pasta. Brown rice. Bulgar wheat. Polenta. Couscous. Whole-wheat bread. [place].  Vegetables: Artichokes. Beets. Broccoli. Cabbage. Carrots. Eggplant. Green beans. Chard. Kale. Spinach. Onions. Leeks. Peas. Squash.  Tomatoes. Peppers. Radishes.  Fruits: Apples. Apricots. Avocado. Berries. Bananas. Cherries. Dates. Figs. Grapes. Lemons. Melon. Oranges. Peaches. Plums. Pomegranate.  Meats and other protein foods: Beans. Almonds. Sunflower seeds. Pine nuts. Peanuts. Sells. Salmon. Scallops. Shrimp. [place]. Tilapia. Clams. Oysters. Eggs.  Dairy: Low-fat milk. Cheese. Greek yogurt.  Beverages: Water. Red wine. Herbal tea.  Fats and oils: Extra virgin olive oil. Avocado oil. Grape seed oil.  Sweets and desserts: [place] yogurt with honey. Baked apples. Poached pears. Trail mix.  Seasoning and other foods: Basil. Cilantro. Coriander. Cumin. Mint. Parsley. Sage. Rosemary. Tarragon. Garlic. Oregano. Thyme. Pepper. Balsalmic vinegar. Tahini. Hummus. Tomato sauce. Olives. Mushrooms. ? Limit these  Grains: Prepackaged pasta or rice dishes. Prepackaged cereal with added sugar.  Vegetables: Deep fried potatoes (french fries).  Fruits: Fruit canned in syrup.  Meats and other protein foods: Beef. Pork. Lamb. Poultry with skin. Hot dogs. Berniece Salines.  Dairy: Ice cream. Sour cream. Whole milk.  Beverages: Juice. Sugar-sweetened soft drinks. Beer. Liquor and spirits.  Fats and oils: Butter. Canola oil. Vegetable oil. Beef fat (tallow). Lard.  Sweets and desserts: Cookies. Cakes. Pies. Candy.  Seasoning and other foods: Mayonnaise. Premade sauces and marinades. The items listed may not be a complete list. Talk with your dietitian about what dietary choices are right for you. Summary  The Mediterranean diet includes both food and lifestyle choices.  Eat a variety of fresh fruits and vegetables, beans, nuts, seeds, and whole grains.  Limit the amount of red meat and sweets that you eat.  Talk with your health care provider about whether it is safe for you to drink red wine in moderation. This means 1 glass a day for nonpregnant women and 2 glasses a day for men. A glass of wine equals 5 oz (150 mL). This information  is not intended to replace advice given to you by your health care provider. Make sure you discuss any questions you have with your health care provider. Document Revised: 12/18/2015 Document Reviewed: 12/11/2015 Elsevier Patient Education  Home all medications as directed. Remain well hydrated, follow Mediterranean Diet. Recent labs from Urologist-stable. Continue to social distance and wear a mask when in public. Basic information for mRNA COVID-19 vaccines Please note that these CDC guidelines can change from week to week, and even day to day.  Refer to the [facility] website for the most current recommendations for any given day at [email or web address].ee   1) Under the EUAs (emergency use authorization), the following age groups are authorized to receive the COVID-19 vaccination: . Pfizer-BioNTech: ages ?16 years . Moderna: ages ?18 years . Children and adolescents outside of these authorized age groups should not receive COVID-19 vaccination at this time   2) You are not eligible to receive a COVID-19 vaccine if: . You have received another vaccine in the last 14-days (example: flu shot, shingles, tetanus, etc.) . You currently have a positive test for COVID-19. The vaccine must be deferred until you have recovered from the acute illness and the criteria has been met for you to  discontinue isolation. . You have received passive antibody therapy (monoclonal antibodies or convalescent serum) as treatment for COVID-19 within the past 90 days   3) Currently COVID-19 vaccines are contraindicated in patients who have had an anaphylactic reaction to polyethylene glycol ( ie. GoLYTELY bowel prep or MiraLAX ) or polysorbate products (often used as a thickening agent for many ice creams, frozen custard and whipped dessert products etc).   Also, if you had an anaphylactic reaction to your first COVID-19 vaccination, you cannot receive your second.     - Also if  you are currently pregnant please check with your obstetrician to see what their recommendations are regarding vaccination.   - Also, if you have a chronic condition such as CLL, RA or other immunocompromising conditions, please know that no data are currently available (or very limited data) on the safety and efficacy of mRNA COVID-19 vaccines in persons with autoimmune/ immunocompromising conditions.   Please seek the advice of your specialist regarding whether or not you should receive your COVID-19 vaccination.   Follow-up with primary care in 6 months for physical with fasting labs.  GREAT TO SEE YOU!

## 2019-06-11 NOTE — Assessment & Plan Note (Signed)
06/2019 PSA 11- he has stopped Testosterone replacement.

## 2019-06-11 NOTE — Assessment & Plan Note (Signed)
He reports ambulatory BP SBP 120-130 DBP 70s He is using upper arm cuff- will alternate arms.  Continue Lisinopril 5mg QD Followed by cards Charlott Rakes

## 2019-06-21 ENCOUNTER — Ambulatory Visit: Payer: PPO | Admitting: Adult Health

## 2019-06-29 ENCOUNTER — Telehealth: Payer: Self-pay | Admitting: Podiatry

## 2019-06-29 NOTE — Telephone Encounter (Signed)
Pt called asking about getting orthotics. He was asking about price and benefit coverage.  Pt has [employer] and I told pt I would put in a request for authorization and I told pt when I got the auth I would call him to get scheduled to be measured.

## 2019-07-02 ENCOUNTER — Other Ambulatory Visit: Payer: Self-pay

## 2019-07-02 ENCOUNTER — Other Ambulatory Visit: Payer: PPO | Admitting: Orthotics

## 2019-07-05 ENCOUNTER — Other Ambulatory Visit: Payer: Self-pay | Admitting: Family Medicine

## 2019-07-05 DIAGNOSIS — R972 Elevated prostate specific antigen [PSA]: Secondary | ICD-10-CM

## 2019-07-15 ENCOUNTER — Ambulatory Visit
Admission: RE | Admit: 2019-07-15 | Discharge: 2019-07-15 | Disposition: A | Discharge status: Home / Self Care | Payer: PPO | Source: Ambulatory Visit | Attending: Family Medicine | Admitting: Family Medicine

## 2019-07-15 ENCOUNTER — Other Ambulatory Visit: Payer: Self-pay

## 2019-07-15 DIAGNOSIS — R972 Elevated prostate specific antigen [PSA]: Secondary | ICD-10-CM | POA: Diagnosis not present

## 2019-07-15 MED ORDER — GADOBENATE DIMEGLUMINE 529 MG/ML IV SOLN
16.0000 mL | Freq: Once | INTRAVENOUS | Status: AC | PRN
  Administered 2019-07-15: 16 mL via INTRAVENOUS

## 2019-07-20 ENCOUNTER — Ambulatory Visit: Payer: PPO | Admitting: Orthotics

## 2019-07-20 ENCOUNTER — Other Ambulatory Visit: Payer: Self-pay

## 2019-07-20 DIAGNOSIS — M779 Enthesopathy, unspecified: Secondary | ICD-10-CM

## 2019-07-20 NOTE — Progress Notes (Signed)
Patient came in today to pick up custom made foot orthotics.  The goals were accomplished and the patient reported no dissatisfaction with said orthotics.  Patient was advised of breakin period and how to report any issues. 

## 2019-08-17 ENCOUNTER — Other Ambulatory Visit: Payer: Self-pay | Admitting: Adult Health

## 2019-10-08 DIAGNOSIS — R972 Elevated prostate specific antigen [PSA]: Secondary | ICD-10-CM | POA: Diagnosis not present

## 2019-10-08 DIAGNOSIS — N401 Enlarged prostate with lower urinary tract symptoms: Secondary | ICD-10-CM | POA: Diagnosis not present

## 2019-10-08 DIAGNOSIS — Z79899 Other long term (current) drug therapy: Secondary | ICD-10-CM | POA: Diagnosis not present

## 2019-10-08 DIAGNOSIS — E291 Testicular hypofunction: Secondary | ICD-10-CM | POA: Diagnosis not present

## 2019-11-14 ENCOUNTER — Other Ambulatory Visit: Payer: Self-pay | Admitting: Physician Assistant

## 2019-12-05 DIAGNOSIS — L814 Other melanin hyperpigmentation: Secondary | ICD-10-CM | POA: Diagnosis not present

## 2019-12-05 DIAGNOSIS — L821 Other seborrheic keratosis: Secondary | ICD-10-CM | POA: Diagnosis not present

## 2019-12-05 DIAGNOSIS — D225 Melanocytic nevi of trunk: Secondary | ICD-10-CM | POA: Diagnosis not present

## 2019-12-05 DIAGNOSIS — L820 Inflamed seborrheic keratosis: Secondary | ICD-10-CM | POA: Diagnosis not present

## 2019-12-10 ENCOUNTER — Telehealth: Payer: Self-pay | Admitting: Physician Assistant

## 2019-12-10 ENCOUNTER — Other Ambulatory Visit: Payer: Self-pay

## 2019-12-10 ENCOUNTER — Other Ambulatory Visit: Payer: PPO

## 2019-12-10 DIAGNOSIS — E785 Hyperlipidemia, unspecified: Secondary | ICD-10-CM | POA: Diagnosis not present

## 2019-12-10 DIAGNOSIS — I10 Essential (primary) hypertension: Secondary | ICD-10-CM | POA: Diagnosis not present

## 2019-12-10 DIAGNOSIS — Z0000 Encounter for general adult medical examination without abnormal findings: Secondary | ICD-10-CM

## 2019-12-10 NOTE — Addendum Note (Signed)
Addended by: Fonnie Mu on: 12/10/2019 02:26 PM   Modules accepted: Orders

## 2019-12-10 NOTE — Telephone Encounter (Signed)
Patient is requesting a Vit D panel be added to his labs that were drawn today in anticipation for his CPE next week.

## 2019-12-10 NOTE — Telephone Encounter (Signed)
Is it ok to add this lab?

## 2019-12-10 NOTE — Telephone Encounter (Signed)
Lab added.  Charyl Bigger, CMA

## 2019-12-10 NOTE — Telephone Encounter (Signed)
I don't see a diagnosis for Vitamin D deficiency/insufficiency and don't see a prior vitamin D lab. Ok to add as long as patient is aware test may not be covered by insurance.   Thank you, Herb Grays

## 2019-12-11 LAB — COMPREHENSIVE METABOLIC PANEL
ALT: 21 IU/L (ref 0–44)
AST: 16 IU/L (ref 0–40)
Albumin/Globulin Ratio: 2.8 — ABNORMAL HIGH (ref 1.2–2.2)
Albumin: 4.8 g/dL (ref 3.8–4.8)
Alkaline Phosphatase: 45 IU/L — ABNORMAL LOW (ref 48–121)
BUN/Creatinine Ratio: 18 (ref 10–24)
BUN: 17 mg/dL (ref 8–27)
Bilirubin Total: 0.6 mg/dL (ref 0.0–1.2)
CO2: 22 mmol/L (ref 20–29)
Calcium: 9.2 mg/dL (ref 8.6–10.2)
Chloride: 100 mmol/L (ref 96–106)
Creatinine, Ser: 0.92 mg/dL (ref 0.76–1.27)
GFR calc Af Amer: 99 mL/min/{1.73_m2} (ref >59)
GFR calc non Af Amer: 86 mL/min/{1.73_m2} (ref >59)
Globulin, Total: 1.7 g/dL (ref 1.5–4.5)
Glucose: 104 mg/dL — ABNORMAL HIGH (ref 65–99)
Potassium: 4.2 mmol/L (ref 3.5–5.2)
Sodium: 136 mmol/L (ref 134–144)
Total Protein: 6.5 g/dL (ref 6.0–8.5)

## 2019-12-11 LAB — CBC WITH DIFFERENTIAL/PLATELET
Basophils Absolute: 0.1 10*3/uL (ref 0.0–0.2)
Basos: 1 %
EOS (ABSOLUTE): 0.2 10*3/uL (ref 0.0–0.4)
Eos: 4 %
Hematocrit: 41.4 % (ref 37.5–51.0)
Hemoglobin: 14.5 g/dL (ref 13.0–17.7)
Immature Grans (Abs): 0 10*3/uL (ref 0.0–0.1)
Immature Granulocytes: 0 %
Lymphocytes Absolute: 2.1 10*3/uL (ref 0.7–3.1)
Lymphs: 33 %
MCH: 30.3 pg (ref 26.6–33.0)
MCHC: 35 g/dL (ref 31.5–35.7)
MCV: 86 fL (ref 79–97)
Monocytes Absolute: 0.5 10*3/uL (ref 0.1–0.9)
Monocytes: 8 %
Neutrophils Absolute: 3.5 10*3/uL (ref 1.4–7.0)
Neutrophils: 54 %
Platelets: 175 10*3/uL (ref 150–450)
RBC: 4.79 x10E6/uL (ref 4.14–5.80)
RDW: 12.9 % (ref 11.6–15.4)
WBC: 6.5 10*3/uL (ref 3.4–10.8)

## 2019-12-11 LAB — HEMOGLOBIN A1C
Est. average glucose Bld gHb Est-mCnc: 108 mg/dL
Hgb A1c MFr Bld: 5.4 % (ref 4.8–5.6)

## 2019-12-11 LAB — TSH: TSH: 3.64 u[IU]/mL (ref 0.450–4.500)

## 2019-12-11 LAB — LIPID PANEL
Chol/HDL Ratio: 3.1 ratio (ref 0.0–5.0)
Cholesterol, Total: 153 mg/dL (ref 100–199)
HDL: 50 mg/dL (ref >39)
LDL Chol Calc (NIH): 84 mg/dL (ref 0–99)
Triglycerides: 104 mg/dL (ref 0–149)
VLDL Cholesterol Cal: 19 mg/dL (ref 5–40)

## 2019-12-17 ENCOUNTER — Ambulatory Visit (INDEPENDENT_AMBULATORY_CARE_PROVIDER_SITE_OTHER): Payer: PPO | Admitting: Physician Assistant

## 2019-12-17 ENCOUNTER — Other Ambulatory Visit: Payer: Self-pay

## 2019-12-17 ENCOUNTER — Encounter: Payer: Self-pay | Admitting: Physician Assistant

## 2019-12-17 VITALS — BP 150/66 | HR 88 | Temp 98.0°F | Ht 68.0 in | Wt 170.6 lb

## 2019-12-17 DIAGNOSIS — I10 Essential (primary) hypertension: Secondary | ICD-10-CM | POA: Diagnosis not present

## 2019-12-17 DIAGNOSIS — Z0000 Encounter for general adult medical examination without abnormal findings: Secondary | ICD-10-CM

## 2019-12-17 DIAGNOSIS — E785 Hyperlipidemia, unspecified: Secondary | ICD-10-CM | POA: Diagnosis not present

## 2019-12-17 NOTE — Progress Notes (Signed)
Male physical   Impression and Recommendations:    1. Healthcare maintenance   2. Hyperlipidemia, unspecified hyperlipidemia type   3. Essential hypertension      1) Anticipatory Guidance: Discussed skin CA prevention and sunscreen when outside along with skin surveillance; eating a balanced and modest diet; physical activity at least 25 minutes per day or minimum of 150 min/ week moderate to intense activity.  2) Immunizations / Screenings / Labs:   All immunizations are up-to-date per recommendations or will be updated today if pt allows.    - Patient understands with dental and vision screens they will schedule independently.  - Obtained CBC, CMP, HgA1c, Lipid panel, and TSH when fasting. Most labs essentially wnl or stable from prior. -UTD on Tdap and Hep C screening. -UTD on AAA screening: neg -Pt declined HIV screening. -Pt declined Shingrix and Pneumococcal vaccines.  3) Weight:  BMI meaning discussed with patient.  Discussed goal to improve diet habits to improve overall feelings of well being and objective health data. Improve nutrient density of diet through increasing intake of fruits and vegetables and decreasing saturated fats, white flour products and refined sugars.   4) Healthcare Maintenance: -Continue current medication regimen. -Continue ambulatory BP and pulse monitoring, notify the clinic if BP consistently above 140/90. BP recheck 150/66, improved. Pt reports white coat syndrome component.  -Follow heart healthy diet and continue moderate-rigorous physical activity. -Stay well hydrated. -Follow up in 6 months for  Reg OV: HTN, Hyperlipidemia and repeat lipid panel, cmp  No orders of the defined types were placed in this encounter.   No orders of the defined types were placed in this encounter.    Return in about 6 months (around 06/18/2020) for HTN, HLD, and FBW (lipid panel, cmp).  Reminded pt important of f-up preventative CPE in 1 year.   Reminded pt again, this is in addition to any chronic care visits.    Gross side effects, risk and benefits, and alternatives of medications discussed with patient.  Patient is aware that all medications have potential side effects and we are unable to predict every side effect or drug-drug interaction that may occur.  Expresses verbal understanding and consents to current therapy plan and treatment regimen.  Please see AVS handed out to patient at the end of our visit for further patient instructions/ counseling done pertaining to today's office visit.  Note:  This note was prepared with assistance of Dragon voice recognition software. Occasional wrong-word or sound-a-like substitutions may have occurred due to the inherent limitations of voice recognition software.    Subjective:        CC: CPE   HPI: Ryan Beard is a 69 y.o. male who presents to [place] [facility] today for a yearly health maintenance exam.     Health Maintenance Summary  - Reviewed and updated, unless pt declines services.  Last Cologuard or Colonoscopy:   03/28/2013- repeat in 5 years due to sessile polyp, pt declines referral for repeat screening. Completed Cologuard 01/2019- negative, repeat in 01/2022 Family history of Colon CA:  No  Tobacco History Reviewed:  Yes, former smoker Abdominal Ultrasound:  Completed 11/29/2017- neg CT scan for screening lung CA:  12/27/18- neg for malignancy, repeat in 12 months- prefers to skip this year Alcohol / drug use:    No concerns, no excessive use / no use Exercise Habits:  Moderate-rigorous physical activity  Dental Home: Y   Eye exams: Y Dermatology  home: Y  Male history: STD concerns:   none Additional penile/ urinary concerns: Followed by urology for elevated PSA   Additional concerns beyond Health Maintenance issues:   none    Immunization History  Administered Date(s) Administered   Tdap 12/20/2018     Health  Maintenance  Topic Date Due   INFLUENZA VACCINE  12/02/2019   COVID-19 Vaccine (1) 01/02/2020 (Originally 08/06/1962)   PNA vac Low Risk Adult (1 of 2 - PCV13) 12/16/2020 (Originally 08/06/2017)   Fecal DNA (Cologuard)  01/09/2022   TETANUS/TDAP  12/19/2028   Hepatitis C Screening  Completed       Wt Readings from Last 3 Encounters:  12/17/19 170 lb 9.6 oz (77.4 kg)  06/11/19 176 lb 14.4 oz (80.2 kg)  02/07/19 173 lb 3.2 oz (78.6 kg)   BP Readings from Last 3 Encounters:  12/17/19 (!) 150/66  06/11/19 (!) 147/71  02/07/19 (!) 144/84   Pulse Readings from Last 3 Encounters:  12/17/19 88  06/11/19 67  02/07/19 65    Patient Active Problem List   Diagnosis Date Noted   Coronary artery calcification 02/07/2019   Essential hypertension 12/21/2017   Abnormal PSA 08/17/2017   Hyperlipidemia 08/17/2017   Transient elevated blood pressure 08/17/2017   Healthcare maintenance 08/17/2017   Family history of diabetes mellitus in sister 08/17/2017    Past Medical History:  Diagnosis Date   Abnormal PSA    Hyperlipidemia     Past Surgical History:  Procedure Laterality Date   APPENDECTOMY  1973   CARDIAC CATHETERIZATION     PROSTATE BIOPSY     SEPTOPLASTY  1960   TONSILLECTOMY  1960    Family History  Problem Relation Age of Onset   Heart disease Mother    Cancer Father        lymphoma   Hypertension Father    Alcohol abuse Sister    Colon cancer Neg Hx    Esophageal cancer Neg Hx    Rectal cancer Neg Hx    Stomach cancer Neg Hx    Prostate cancer Neg Hx     Social History   Substance and Sexual Activity  Drug Use No  ,  Social History   Substance and Sexual Activity  Alcohol Use Yes   Alcohol/week: 1.0 standard drink   Types: 1 Glasses of wine per week  ,  Social History   Tobacco Use  Smoking Status Former Smoker   Packs/day: 1.00   Years: 30.00   Pack years: 30.00   Quit date: 05/03/2005   Years since quitting:  14.6  Smokeless Tobacco Never Used  ,  Social History   Substance and Sexual Activity  Sexual Activity Yes   Birth control/protection: None, Post-menopausal    Patient's Medications  New Prescriptions   No medications on file  Previous Medications   ACETYLCYSTEINE (N-ACETYL-L-CYSTEINE PO)    Take 600 mg by mouth daily.   BLACK PEPPER-TURMERIC PO    Take 600 mg by mouth daily.   CHELATED MAGNESIUM PO    Take 1 tablet by mouth daily.   CHOLECALCIFEROL (VITAMIN D-3) 5000 UNITS TABS    Take by mouth daily.   CYANOCOBALAMIN (B-12) 2500 MCG TABS    Take by mouth daily.   KRILL OIL 500 MG CAPS    Take 1 capsule by mouth daily.   L-ARGININE 500 MG TABS    Take 1 tablet by mouth daily.   LISINOPRIL (ZESTRIL) 5 MG TABLET    TAKE  1 TABLET BY MOUTH DAILY   MISC NATURAL PRODUCTS (BETA-SITOSTEROL PLANT STEROLS) CAPS    Take 160 mg by mouth daily.   PRASTERONE, DHEA, (DHEA 50 PO)    Take 1 tablet by mouth daily.   PREGNENOLONE POWD    75 mg by Does not apply route daily.   RESVERATROL 250 MG CAPS    Take by mouth daily.   ROSUVASTATIN (CRESTOR) 20 MG TABLET    Take 1 tablet (20 mg total) by mouth daily.   SAW PALMETTO 160 MG CAPSULE    Take 160 mg by mouth 2 (two) times daily.   TADALAFIL (CIALIS) 5 MG TABLET    Take 5 mg by mouth daily as needed for erectile dysfunction.   UNABLE TO FIND    daily. Med Name: ZMA b6 magnesium and zinc   UNABLE TO FIND    Take 1 tablet by mouth daily. Med Name: Enhanced PQQ with 50 mg Ubiquinol   UNABLE TO FIND    Take 150 mg by mouth daily. Med Name: K2 MK7   UNABLE TO FIND    Med Name: Olipure BP   UNABLE TO FIND    Take 12.5 mg by mouth daily. Med Name: I-Throid Iodine   VITAMIN C (ASCORBIC ACID) 500 MG TABLET    Take 500 mg by mouth daily.   VITAMIN E (VITAMIN E) 400 UNIT CAPSULE    Take 400 Units by mouth daily.  Modified Medications   No medications on file  Discontinued Medications   No medications on file    Patient has no known allergies.  Review  of Systems: General:   Denies fever, chills, unexplained weight loss.  Optho/Auditory:   Denies visual changes, blurred vision/LOV Respiratory:   Denies SOB, DOE more than baseline levels.   Cardiovascular:   Denies chest pain, palpitations, new onset peripheral edema  Gastrointestinal:   Denies nausea, vomiting, diarrhea.  Genitourinary: Denies dysuria, freq/ urgency, flank pain or discharge from genitals.  Endocrine:     Denies hot or cold intolerance, polyuria, polydipsia. Musculoskeletal:   Denies unexplained myalgias, joint swelling, unexplained arthralgias, gait problems.  Skin:  Denies rash, suspicious lesions Neurological:     Denies dizziness, unexplained weakness, numbness  Psychiatric/Behavioral:   Denies mood changes, suicidal or homicidal ideations, hallucinations    Objective:     Blood pressure (!) 150/66, pulse 88, temperature 98 F (36.7 C), temperature source Oral, height 5\' 8"  (1.727 m), weight 170 lb 9.6 oz (77.4 kg), SpO2 98 %. Body mass index is 25.94 kg/m. General Appearance:    Alert, cooperative, no distress, appears stated age  Head:    Normocephalic, without obvious abnormality, atraumatic  Eyes:    PERRL, conjunctiva/corneas clear, EOM's intact, both eyes  Ears:    Normal TM's and external ear canals, both ears  Nose:   Nares normal, septum midline, mucosa normal, no drainage    or sinus tenderness  Throat:   Lips w/o lesion, mucosa moist, and tongue normal; teeth and gums normal  Neck:   Supple, symmetrical, trachea midline, no adenopathy;    thyroid:  no enlargement/tenderness/nodules; no carotid   bruit or JVD  Back:     Symmetric, no curvature, ROM normal, no CVA tenderness  Lungs:     Clear to auscultation bilaterally, respirations unlabored, no  Wh/ R/ R  Chest Wall:    No tenderness or gross deformity; normal excursion   Heart:    Regular rate and rhythm, S1 and S2 normal,  no murmur, rub   or gallop  Abdomen:     Soft, non-tender, bowel sounds  active all four quadrants, No G/R/R, no masses, no organomegaly  Genitalia:    Deferred.  Rectal:    Followed by urology.  Extremities:   Extremities normal, atraumatic, no cyanosis or gross edema  Pulses:   2+ and symmetric all extremities  Skin:   Warm, dry, Skin color, texture, turgor normal, no obvious rashes or lesions  M-Sk:   Ambulates * 4 w/o difficulty, no gross deformities, tone WNL  Neurologic:   CNII-XII grossly intact, normal strength, sensation and reflexes    Throughout Psych:  No HI/SI, judgement and insight good, Euthymic mood. Full Affect.

## 2019-12-17 NOTE — Patient Instructions (Signed)
Preventive Care 65 Years and Older, Male Preventive care refers to lifestyle choices and visits with your health care provider that can promote health and wellness. This includes:  A yearly physical exam. This is also called an annual well check.  Regular dental and eye exams.  Immunizations.  Screening for certain conditions.  Healthy lifestyle choices, such as diet and exercise. What can I expect for my preventive care visit? Physical exam Your health care provider will check:  Height and weight. These may be used to calculate body mass index (BMI), which is a measurement that tells if you are at a healthy weight.  Heart rate and blood pressure.  Your skin for abnormal spots. Counseling Your health care provider may ask you questions about:  Alcohol, tobacco, and drug use.  Emotional well-being.  Home and relationship well-being.  Sexual activity.  Eating habits.  History of falls.  Memory and ability to understand (cognition).  Work and work environment. What immunizations do I need?  Influenza (flu) vaccine  This is recommended every year. Tetanus, diphtheria, and pertussis (Tdap) vaccine  You may need a Td booster every 10 years. Varicella (chickenpox) vaccine  You may need this vaccine if you have not already been vaccinated. Zoster (shingles) vaccine  You may need this after age 60. Pneumococcal conjugate (PCV13) vaccine  One dose is recommended after age 65. Pneumococcal polysaccharide (PPSV23) vaccine  One dose is recommended after age 65. Measles, mumps, and rubella (MMR) vaccine  You may need at least one dose of MMR if you were born in 1957 or later. You may also need a second dose. Meningococcal conjugate (MenACWY) vaccine  You may need this if you have certain conditions. Hepatitis A vaccine  You may need this if you have certain conditions or if you travel or work in places where you may be exposed to hepatitis A. Hepatitis B  vaccine  You may need this if you have certain conditions or if you travel or work in places where you may be exposed to hepatitis B. Haemophilus influenzae type b (Hib) vaccine  You may need this if you have certain conditions. You may receive vaccines as individual doses or as more than one vaccine together in one shot (combination vaccines). Talk with your health care provider about the risks and benefits of combination vaccines. What tests do I need? Blood tests  Lipid and cholesterol levels. These may be checked every 5 years, or more frequently depending on your overall health.  Hepatitis C test.  Hepatitis B test. Screening  Lung cancer screening. You may have this screening every year starting at age 55 if you have a 30-pack-year history of smoking and currently smoke or have quit within the past 15 years.  Colorectal cancer screening. All adults should have this screening starting at age 50 and continuing until age 75. Your health care provider may recommend screening at age 45 if you are at increased risk. You will have tests every 1-10 years, depending on your results and the type of screening test.  Prostate cancer screening. Recommendations will vary depending on your family history and other risks.  Diabetes screening. This is done by checking your blood sugar (glucose) after you have not eaten for a while (fasting). You may have this done every 1-3 years.  Abdominal aortic aneurysm (AAA) screening. You may need this if you are a current or former smoker.  Sexually transmitted disease (STD) testing. Follow these instructions at home: Eating and drinking  Eat   a diet that includes fresh fruits and vegetables, whole grains, lean protein, and low-fat dairy products. Limit your intake of foods with high amounts of sugar, saturated fats, and salt.  Take vitamin and mineral supplements as recommended by your health care provider.  Do not drink alcohol if your health care  provider tells you not to drink.  If you drink alcohol: ? Limit how much you have to 0-2 drinks a day. ? Be aware of how much alcohol is in your drink. In the [place] one drink equals one 12 oz bottle of beer (355 mL), one 5 oz glass of wine (148 mL), or one 1 oz glass of hard liquor (44 mL). Lifestyle  Take daily care of your teeth and gums.  Stay active. Exercise for at least 30 minutes on 5 or more days each week.  Do not use any products that contain nicotine or tobacco, such as cigarettes, e-cigarettes, and chewing tobacco. If you need help quitting, ask your health care provider.  If you are sexually active, practice safe sex. Use a condom or other form of protection to prevent STIs (sexually transmitted infections).  Talk with your health care provider about taking a low-dose aspirin or statin. What's next?  Visit your health care provider once a year for a well check visit.  Ask your health care provider how often you should have your eyes and teeth checked.  Stay up to date on all vaccines. This information is not intended to replace advice given to you by your health care provider. Make sure you discuss any questions you have with your health care provider. Document Revised: 04/13/2018 Document Reviewed: 04/13/2018 Elsevier Patient Education  2020 [employer].

## 2020-01-31 ENCOUNTER — Other Ambulatory Visit: Payer: Self-pay | Admitting: Physician Assistant

## 2020-04-09 DIAGNOSIS — Z79899 Other long term (current) drug therapy: Secondary | ICD-10-CM | POA: Diagnosis not present

## 2020-04-09 DIAGNOSIS — R972 Elevated prostate specific antigen [PSA]: Secondary | ICD-10-CM | POA: Diagnosis not present

## 2020-04-09 DIAGNOSIS — E291 Testicular hypofunction: Secondary | ICD-10-CM | POA: Diagnosis not present

## 2020-04-09 DIAGNOSIS — N401 Enlarged prostate with lower urinary tract symptoms: Secondary | ICD-10-CM | POA: Diagnosis not present

## 2020-04-09 LAB — TESTOSTERONE: Testosterone: 308.1

## 2020-04-21 ENCOUNTER — Other Ambulatory Visit: Payer: Self-pay | Admitting: Physician Assistant

## 2020-05-30 ENCOUNTER — Other Ambulatory Visit: Payer: Self-pay | Admitting: Physician Assistant

## 2020-05-30 DIAGNOSIS — I10 Essential (primary) hypertension: Secondary | ICD-10-CM

## 2020-05-30 DIAGNOSIS — Z0000 Encounter for general adult medical examination without abnormal findings: Secondary | ICD-10-CM

## 2020-05-30 DIAGNOSIS — E785 Hyperlipidemia, unspecified: Secondary | ICD-10-CM

## 2020-06-03 ENCOUNTER — Other Ambulatory Visit: Payer: Self-pay | Admitting: Cardiology

## 2020-06-06 ENCOUNTER — Other Ambulatory Visit: Payer: Self-pay

## 2020-06-06 ENCOUNTER — Other Ambulatory Visit: Payer: PPO

## 2020-06-06 DIAGNOSIS — E785 Hyperlipidemia, unspecified: Secondary | ICD-10-CM | POA: Diagnosis not present

## 2020-06-06 DIAGNOSIS — Z0000 Encounter for general adult medical examination without abnormal findings: Secondary | ICD-10-CM | POA: Diagnosis not present

## 2020-06-06 DIAGNOSIS — I10 Essential (primary) hypertension: Secondary | ICD-10-CM

## 2020-06-07 LAB — COMPREHENSIVE METABOLIC PANEL
ALT: 23 IU/L (ref 0–44)
AST: 14 IU/L (ref 0–40)
Albumin/Globulin Ratio: 3.5 — ABNORMAL HIGH (ref 1.2–2.2)
Albumin: 4.9 g/dL — ABNORMAL HIGH (ref 3.8–4.8)
Alkaline Phosphatase: 43 IU/L — ABNORMAL LOW (ref 44–121)
BUN/Creatinine Ratio: 14 (ref 10–24)
BUN: 14 mg/dL (ref 8–27)
Bilirubin Total: 0.5 mg/dL (ref 0.0–1.2)
CO2: 23 mmol/L (ref 20–29)
Calcium: 9.7 mg/dL (ref 8.6–10.2)
Chloride: 104 mmol/L (ref 96–106)
Creatinine, Ser: 0.97 mg/dL (ref 0.76–1.27)
GFR calc Af Amer: 93 mL/min/{1.73_m2} (ref >59)
GFR calc non Af Amer: 80 mL/min/{1.73_m2} (ref >59)
Globulin, Total: 1.4 g/dL — ABNORMAL LOW (ref 1.5–4.5)
Glucose: 96 mg/dL (ref 65–99)
Potassium: 4.3 mmol/L (ref 3.5–5.2)
Sodium: 139 mmol/L (ref 134–144)
Total Protein: 6.3 g/dL (ref 6.0–8.5)

## 2020-06-07 LAB — LIPID PANEL
Chol/HDL Ratio: 3.8 ratio (ref 0.0–5.0)
Cholesterol, Total: 169 mg/dL (ref 100–199)
HDL: 45 mg/dL (ref >39)
LDL Chol Calc (NIH): 100 mg/dL — ABNORMAL HIGH (ref 0–99)
Triglycerides: 137 mg/dL (ref 0–149)
VLDL Cholesterol Cal: 24 mg/dL (ref 5–40)

## 2020-06-09 ENCOUNTER — Ambulatory Visit (INDEPENDENT_AMBULATORY_CARE_PROVIDER_SITE_OTHER): Payer: PPO | Admitting: Physician Assistant

## 2020-06-09 ENCOUNTER — Other Ambulatory Visit: Payer: Self-pay

## 2020-06-09 ENCOUNTER — Encounter: Payer: Self-pay | Admitting: Physician Assistant

## 2020-06-09 VITALS — BP 160/62 | HR 77 | Temp 97.9°F | Ht 68.0 in | Wt 172.8 lb

## 2020-06-09 DIAGNOSIS — I10 Essential (primary) hypertension: Secondary | ICD-10-CM | POA: Diagnosis not present

## 2020-06-09 DIAGNOSIS — R972 Elevated prostate specific antigen [PSA]: Secondary | ICD-10-CM | POA: Diagnosis not present

## 2020-06-09 DIAGNOSIS — E785 Hyperlipidemia, unspecified: Secondary | ICD-10-CM

## 2020-06-09 DIAGNOSIS — Z8639 Personal history of other endocrine, nutritional and metabolic disease: Secondary | ICD-10-CM | POA: Diagnosis not present

## 2020-06-09 NOTE — Patient Instructions (Signed)

## 2020-06-09 NOTE — Progress Notes (Signed)
Established Patient Office Visit  Subjective:  Patient ID: Ryan Beard, male    DOB: 08/31/1950  Age: 70 y.o. MRN: [id number]  CC:  Chief Complaint  Patient presents with  . Hypertension  . Hyperlipidemia    HPI Ryan Beard presents for follow up on hypertension and hyperlipidemia.   HTN: Pt denies chest pain, palpitations, dizziness, headache or lower extermity swelling. Taking medication as directed without side effects. Checks BP at home  and readings 130/70s. Pt tries to follow low sodium diet but has not been as diligent. Tries to stay hydrated.  HLD: Pt taking medication as directed. Reports sometimes will have myalgias/arthralgias and will reduce Crestor dose to half tablet (10 mg) until symptoms subside. Has been eating more junk food (Doritos) lately and plans to cut back. Does not eat out, has home cooked meals, limits red meat. Continues to say active with moderate-rigorous physical activity.   Abnormal PSA: Is followed by urology.  Patient has a history of elevated PSA and work-up has revealed no clear etiology.  Patient reports was on testosterone gel which was contributing to PSA elevation.  Vitamin D deficiency: Reports a history of vitamin D deficiency in the past which required weekly vitamin D treatment. Currently takes vitamin D3 5000 units daily.  Patient declines influenza and Covid vaccines.  Past Medical History:  Diagnosis Date  . Abnormal PSA   . Hyperlipidemia     Past Surgical History:  Procedure Laterality Date  . APPENDECTOMY  1973  . CARDIAC CATHETERIZATION    . PROSTATE BIOPSY    . SEPTOPLASTY  1960  . TONSILLECTOMY  1960    Family History  Problem Relation Age of Onset  . Heart disease Mother   . Cancer Father        lymphoma  . Hypertension Father   . Alcohol abuse Sister   . Colon cancer Neg Hx   . Esophageal cancer Neg Hx   . Rectal cancer Neg Hx   . Stomach cancer Neg Hx   . Prostate cancer Neg Hx     Social History    Socioeconomic History  . Marital status: Single    Spouse name: Not on file  . Number of children: Not on file  . Years of education: Not on file  . Highest education level: Not on file  Occupational History  . Not on file  Tobacco Use  . Smoking status: Former Smoker    Packs/day: 1.00    Years: 30.00    Pack years: 30.00    Quit date: 05/03/2005    Years since quitting: 15.1  . Smokeless tobacco: Never Used  Vaping Use  . Vaping Use: Never used  Substance and Sexual Activity  . Alcohol use: Yes    Alcohol/week: 1.0 standard drink    Types: 1 Glasses of wine per week  . Drug use: No  . Sexual activity: Yes    Birth control/protection: None, Post-menopausal  Other Topics Concern  . Not on file  Social History Narrative  . Not on file   Social Determinants of Health   Financial Resource Strain: Not on file  Food Insecurity: Not on file  Transportation Needs: Not on file  Physical Activity: Not on file  Stress: Not on file  Social Connections: Not on file  Intimate Partner Violence: Not on file    Outpatient Medications Prior to Visit  Medication Sig Dispense Refill  . Acetylcysteine (N-ACETYL-L-CYSTEINE PO) Take 600 mg by mouth daily.    Marland Kitchen  BLACK PEPPER-TURMERIC PO Take 600 mg by mouth daily.    . CHELATED MAGNESIUM PO Take 1 tablet by mouth daily.    . Cholecalciferol (VITAMIN D-3) 5000 UNITS TABS Take by mouth daily.    . Cyanocobalamin (B-12) 2500 MCG TABS Take by mouth daily.    Javier Docker Oil 500 MG CAPS Take 1 capsule by mouth daily.    Marland Kitchen L-Arginine 500 MG TABS Take 1 tablet by mouth daily.    Marland Kitchen lisinopril (ZESTRIL) 5 MG tablet TAKE 1 TABLET BY MOUTH DAILY 90 tablet 0  . Prasterone, DHEA, (DHEA 50 PO) Take 1 tablet by mouth daily.    . Pregnenolone POWD 75 mg by Does not apply route daily.    . rosuvastatin (CRESTOR) 20 MG tablet Take 1 tablet (20 mg total) by mouth daily. 90 tablet 3  . saw palmetto 160 MG capsule Take 160 mg by mouth 2 (two) times daily.     . tadalafil (CIALIS) 5 MG tablet Take 5 mg by mouth daily as needed for erectile dysfunction.    Marland Kitchen UNABLE TO FIND daily. Med Name: ZMA b6 magnesium and zinc    . UNABLE TO FIND Take 150 mg by mouth daily. Med Name: K2 MK7    . UNABLE TO FIND Med Name: Olipure BP    . UNABLE TO FIND Take 12.5 mg by mouth daily. Med Name: I-Throid Iodine    . vitamin C (ASCORBIC ACID) 500 MG tablet Take 500 mg by mouth daily.    . vitamin E 180 MG (400 UNITS) capsule Take 400 Units by mouth daily.    . Misc Natural Products (BETA-SITOSTEROL PLANT STEROLS) CAPS Take 160 mg by mouth daily.    Marland Kitchen Resveratrol 250 MG CAPS Take by mouth daily.    Marland Kitchen UNABLE TO FIND Take 1 tablet by mouth daily. Med Name: Enhanced PQQ with 50 mg Ubiquinol     No facility-administered medications prior to visit.    No Known Allergies  ROS Review of Systems A fourteen system review of systems was performed and found to be positive as per HPI.    Objective:    Physical Exam General:  Well Developed, well nourished, in no acute distress   Neuro:  Alert and oriented,  extra-ocular muscles intact  HEENT:  Normocephalic, atraumatic, neck supple Skin:  no gross rash, warm, pink. Cardiac:  RRR, S1 S2 Respiratory:  ECTA B/L w/o wheezing, Not using accessory muscles, speaking in full sentences- unlabored. Vascular:  Ext warm, no cyanosis apprec.; no gross edema Psych:  No HI/SI, judgement and insight good, Euthymic mood. Full Affect.   BP (!) 160/62   Pulse 77   Temp 97.9 F (36.6 C)   Ht 5\' 8"  (1.727 m)   Wt 172 lb 12.8 oz (78.4 kg)   BMI 26.27 kg/m  Wt Readings from Last 3 Encounters:  06/09/20 172 lb 12.8 oz (78.4 kg)  12/17/19 170 lb 9.6 oz (77.4 kg)  06/11/19 176 lb 14.4 oz (80.2 kg)     Health Maintenance Due  Topic Date Due  . COVID-19 Vaccine (1) Never done  . INFLUENZA VACCINE  Never done    There are no preventive care reminders to display for this patient.  Lab Results  Component Value Date   TSH  3.640 12/10/2019   Lab Results  Component Value Date   WBC 6.5 12/10/2019   HGB 14.5 12/10/2019   HCT 41.4 12/10/2019   MCV 86 12/10/2019   PLT 175  12/10/2019   Lab Results  Component Value Date   NA 139 06/06/2020   K 4.3 06/06/2020   CO2 23 06/06/2020   GLUCOSE 96 06/06/2020   BUN 14 06/06/2020   CREATININE 0.97 06/06/2020   BILITOT 0.5 06/06/2020   ALKPHOS 43 (L) 06/06/2020   AST 14 06/06/2020   ALT 23 06/06/2020   PROT 6.3 06/06/2020   ALBUMIN 4.9 (H) 06/06/2020   CALCIUM 9.7 06/06/2020   Lab Results  Component Value Date   CHOL 169 06/06/2020   Lab Results  Component Value Date   HDL 45 06/06/2020   Lab Results  Component Value Date   LDLCALC 100 (H) 06/06/2020   Lab Results  Component Value Date   TRIG 137 06/06/2020   Lab Results  Component Value Date   CHOLHDL 3.8 06/06/2020   Lab Results  Component Value Date   HGBA1C 5.4 12/10/2019      Assessment & Plan:   Problem List Items Addressed This Visit      Cardiovascular and Mediastinum   Essential hypertension - Primary     Other   Abnormal PSA (Chronic)   Relevant Orders   PSA   Hyperlipidemia (Chronic)    Other Visit Diagnoses    History of vitamin D deficiency       Relevant Orders   Vitamin D (25 hydroxy)     Essential hypertension: -BP elevated in the office.  Reviewed prior BP office readings which have been elevated so likely component of whitecoat syndrome.  Ambulatory BP readings are fairly controlled.  Advised to continue ambulatory BP readings and if BP consistently >135/85 should notify the clinic for medication adjustments. -Recommend to follow a low-sodium diet and stay well-hydrated, at least 64 fluid ounces. -Will continue to monitor.  Recent CMP-renal function and electrolytes WNL  Hyperlipidemia: -Recent lipid panel: Total cholesterol 169, total assessment 37, HDL 45, LDL 100  -Advised to reduce saturated and transfats (snacks).  Continue exercise  regimen. -Continue current medication regimen.  Recent ALT WNL. -Will continue to monitor alongside cardiology.  Abnormal PSA: -Last PSA 13.4 (pt brings copy of labs) -Will repeat PSA today. -Continue to follow-up with urology.  History of Vitamin D deficiency: -Will check vitamin D today. -Continue vitamin D3.  Pending lab results will adjust treatment plan if indicated.  No orders of the defined types were placed in this encounter.   Follow-up: Return in about 6 months (around 12/07/2020) for CPE and FBW/PSA.   Note:  This note was prepared with assistance of Dragon voice recognition software. Occasional wrong-word or sound-a-like substitutions may have occurred due to the inherent limitations of voice recognition software.  Lorrene Reid, PA-C

## 2020-06-10 ENCOUNTER — Encounter: Payer: Self-pay | Admitting: Physician Assistant

## 2020-06-10 LAB — PSA: Prostate Specific Ag, Serum: 11.4 ng/mL — ABNORMAL HIGH (ref 0.0–4.0)

## 2020-06-10 LAB — VITAMIN D 25 HYDROXY (VIT D DEFICIENCY, FRACTURES): Vit D, 25-Hydroxy: 57 ng/mL (ref 30.0–100.0)

## 2020-06-14 ENCOUNTER — Other Ambulatory Visit: Payer: Self-pay | Admitting: Cardiology

## 2020-07-16 ENCOUNTER — Ambulatory Visit: Payer: PPO | Admitting: Podiatry

## 2020-07-16 ENCOUNTER — Other Ambulatory Visit: Payer: Self-pay

## 2020-07-16 ENCOUNTER — Encounter: Payer: Self-pay | Admitting: Podiatry

## 2020-07-16 DIAGNOSIS — M779 Enthesopathy, unspecified: Secondary | ICD-10-CM | POA: Diagnosis not present

## 2020-07-16 DIAGNOSIS — M21619 Bunion of unspecified foot: Secondary | ICD-10-CM

## 2020-07-19 NOTE — Progress Notes (Signed)
Subjective:   Patient ID: Ryan Beard, male   DOB: 70 y.o.   MRN: [id number]   HPI Patient presents stating that he is having some reduction in the effectiveness of his orthotics and they have been good for him but they are starting to flatten out and he needs a new pair made   ROS      Objective:  Physical Exam  Neurovascular status found to be intact with subsecond metatarsal left found to have inflammation moderate in its intensity with orthotics that have lost their ability to hold his arch up properly and take stress off this area     Assessment:  Inflammatory capsulitis of the second MPJ left with chronic nature to condition controlled well with orthotics     Plan:  Reviewed condition recommended new orthotics and patient will have a new pair made to reduce the stress against the metatarsophalangeal joint.  Educated him on these and patient will be seen back when orthotics are returned

## 2020-08-13 ENCOUNTER — Other Ambulatory Visit: Payer: Self-pay | Admitting: Physician Assistant

## 2020-08-25 ENCOUNTER — Other Ambulatory Visit (HOSPITAL_BASED_OUTPATIENT_CLINIC_OR_DEPARTMENT_OTHER): Payer: Self-pay | Admitting: Cardiology

## 2020-08-26 ENCOUNTER — Telehealth: Payer: Self-pay | Admitting: Physician Assistant

## 2020-08-26 DIAGNOSIS — R1030 Lower abdominal pain, unspecified: Secondary | ICD-10-CM

## 2020-08-26 DIAGNOSIS — M542 Cervicalgia: Secondary | ICD-10-CM

## 2020-08-26 NOTE — Addendum Note (Signed)
Addended by: Mickel Crow on: 08/26/2020 09:25 AM   Modules accepted: Orders

## 2020-08-26 NOTE — Telephone Encounter (Signed)
Groin pull and neck issues. They use needles to relax muscles.

## 2020-08-26 NOTE — Telephone Encounter (Signed)
What is the referral for? 

## 2020-08-26 NOTE — Telephone Encounter (Signed)
Referral has been placed. AS, CMA 

## 2020-08-26 NOTE — Telephone Encounter (Signed)
Patient would like a referral to [facility], Dr Wanda Plump. Please advise, thanks. [phone]. Please advise, thanks.

## 2020-10-01 DIAGNOSIS — S161XXD Strain of muscle, fascia and tendon at neck level, subsequent encounter: Secondary | ICD-10-CM | POA: Diagnosis not present

## 2020-10-01 DIAGNOSIS — M62838 Other muscle spasm: Secondary | ICD-10-CM | POA: Diagnosis not present

## 2020-10-01 DIAGNOSIS — M6289 Other specified disorders of muscle: Secondary | ICD-10-CM | POA: Diagnosis not present

## 2020-10-08 DIAGNOSIS — Z79899 Other long term (current) drug therapy: Secondary | ICD-10-CM | POA: Diagnosis not present

## 2020-10-08 DIAGNOSIS — R972 Elevated prostate specific antigen [PSA]: Secondary | ICD-10-CM | POA: Diagnosis not present

## 2020-10-08 DIAGNOSIS — N401 Enlarged prostate with lower urinary tract symptoms: Secondary | ICD-10-CM | POA: Diagnosis not present

## 2020-10-08 DIAGNOSIS — E291 Testicular hypofunction: Secondary | ICD-10-CM | POA: Diagnosis not present

## 2020-10-15 DIAGNOSIS — M62838 Other muscle spasm: Secondary | ICD-10-CM | POA: Diagnosis not present

## 2020-10-15 DIAGNOSIS — M6289 Other specified disorders of muscle: Secondary | ICD-10-CM | POA: Diagnosis not present

## 2020-10-15 DIAGNOSIS — S161XXD Strain of muscle, fascia and tendon at neck level, subsequent encounter: Secondary | ICD-10-CM | POA: Diagnosis not present

## 2020-10-20 DIAGNOSIS — M6289 Other specified disorders of muscle: Secondary | ICD-10-CM | POA: Diagnosis not present

## 2020-10-20 DIAGNOSIS — S161XXD Strain of muscle, fascia and tendon at neck level, subsequent encounter: Secondary | ICD-10-CM | POA: Diagnosis not present

## 2020-10-20 DIAGNOSIS — M62838 Other muscle spasm: Secondary | ICD-10-CM | POA: Diagnosis not present

## 2020-10-29 DIAGNOSIS — M62838 Other muscle spasm: Secondary | ICD-10-CM | POA: Diagnosis not present

## 2020-10-29 DIAGNOSIS — M6289 Other specified disorders of muscle: Secondary | ICD-10-CM | POA: Diagnosis not present

## 2020-10-29 DIAGNOSIS — S161XXD Strain of muscle, fascia and tendon at neck level, subsequent encounter: Secondary | ICD-10-CM | POA: Diagnosis not present

## 2020-11-05 ENCOUNTER — Other Ambulatory Visit: Payer: Self-pay | Admitting: Physician Assistant

## 2020-11-05 DIAGNOSIS — M6289 Other specified disorders of muscle: Secondary | ICD-10-CM | POA: Diagnosis not present

## 2020-11-05 DIAGNOSIS — S161XXD Strain of muscle, fascia and tendon at neck level, subsequent encounter: Secondary | ICD-10-CM | POA: Diagnosis not present

## 2020-11-05 DIAGNOSIS — M62838 Other muscle spasm: Secondary | ICD-10-CM | POA: Diagnosis not present

## 2020-11-12 DIAGNOSIS — M62838 Other muscle spasm: Secondary | ICD-10-CM | POA: Diagnosis not present

## 2020-11-12 DIAGNOSIS — M6289 Other specified disorders of muscle: Secondary | ICD-10-CM | POA: Diagnosis not present

## 2020-11-12 DIAGNOSIS — S161XXD Strain of muscle, fascia and tendon at neck level, subsequent encounter: Secondary | ICD-10-CM | POA: Diagnosis not present

## 2020-12-03 ENCOUNTER — Other Ambulatory Visit: Payer: Self-pay

## 2020-12-03 DIAGNOSIS — Z1321 Encounter for screening for nutritional disorder: Secondary | ICD-10-CM

## 2020-12-03 DIAGNOSIS — E7800 Pure hypercholesterolemia, unspecified: Secondary | ICD-10-CM

## 2020-12-03 DIAGNOSIS — S161XXD Strain of muscle, fascia and tendon at neck level, subsequent encounter: Secondary | ICD-10-CM | POA: Diagnosis not present

## 2020-12-03 DIAGNOSIS — R972 Elevated prostate specific antigen [PSA]: Secondary | ICD-10-CM

## 2020-12-03 DIAGNOSIS — Z130 Encounter for screening for diseases of the blood and blood-forming organs and certain disorders involving the immune mechanism: Secondary | ICD-10-CM

## 2020-12-03 DIAGNOSIS — M62838 Other muscle spasm: Secondary | ICD-10-CM | POA: Diagnosis not present

## 2020-12-03 DIAGNOSIS — I10 Essential (primary) hypertension: Secondary | ICD-10-CM

## 2020-12-03 DIAGNOSIS — M6289 Other specified disorders of muscle: Secondary | ICD-10-CM | POA: Diagnosis not present

## 2020-12-04 ENCOUNTER — Other Ambulatory Visit: Payer: PPO

## 2020-12-04 ENCOUNTER — Other Ambulatory Visit: Payer: Self-pay

## 2020-12-04 DIAGNOSIS — Z1329 Encounter for screening for other suspected endocrine disorder: Secondary | ICD-10-CM | POA: Diagnosis not present

## 2020-12-04 DIAGNOSIS — E7800 Pure hypercholesterolemia, unspecified: Secondary | ICD-10-CM | POA: Diagnosis not present

## 2020-12-04 DIAGNOSIS — I10 Essential (primary) hypertension: Secondary | ICD-10-CM

## 2020-12-04 DIAGNOSIS — R972 Elevated prostate specific antigen [PSA]: Secondary | ICD-10-CM | POA: Diagnosis not present

## 2020-12-04 DIAGNOSIS — D225 Melanocytic nevi of trunk: Secondary | ICD-10-CM | POA: Diagnosis not present

## 2020-12-04 DIAGNOSIS — L814 Other melanin hyperpigmentation: Secondary | ICD-10-CM | POA: Diagnosis not present

## 2020-12-04 DIAGNOSIS — L918 Other hypertrophic disorders of the skin: Secondary | ICD-10-CM | POA: Diagnosis not present

## 2020-12-04 DIAGNOSIS — Z8639 Personal history of other endocrine, nutritional and metabolic disease: Secondary | ICD-10-CM

## 2020-12-04 DIAGNOSIS — L570 Actinic keratosis: Secondary | ICD-10-CM | POA: Diagnosis not present

## 2020-12-04 DIAGNOSIS — D1801 Hemangioma of skin and subcutaneous tissue: Secondary | ICD-10-CM | POA: Diagnosis not present

## 2020-12-04 DIAGNOSIS — Z1321 Encounter for screening for nutritional disorder: Secondary | ICD-10-CM

## 2020-12-04 DIAGNOSIS — Z130 Encounter for screening for diseases of the blood and blood-forming organs and certain disorders involving the immune mechanism: Secondary | ICD-10-CM

## 2020-12-04 DIAGNOSIS — Z13228 Encounter for screening for other metabolic disorders: Secondary | ICD-10-CM | POA: Diagnosis not present

## 2020-12-04 DIAGNOSIS — L821 Other seborrheic keratosis: Secondary | ICD-10-CM | POA: Diagnosis not present

## 2020-12-05 ENCOUNTER — Other Ambulatory Visit: Payer: PPO

## 2020-12-05 LAB — CBC WITH DIFFERENTIAL/PLATELET
Basophils Absolute: 0 10*3/uL (ref 0.0–0.2)
Basos: 1 %
EOS (ABSOLUTE): 0.2 10*3/uL (ref 0.0–0.4)
Eos: 4 %
Hematocrit: 40.1 % (ref 37.5–51.0)
Hemoglobin: 13.7 g/dL (ref 13.0–17.7)
Immature Grans (Abs): 0 10*3/uL (ref 0.0–0.1)
Immature Granulocytes: 0 %
Lymphocytes Absolute: 1.5 10*3/uL (ref 0.7–3.1)
Lymphs: 27 %
MCH: 29.8 pg (ref 26.6–33.0)
MCHC: 34.2 g/dL (ref 31.5–35.7)
MCV: 87 fL (ref 79–97)
Monocytes Absolute: 0.4 10*3/uL (ref 0.1–0.9)
Monocytes: 8 %
Neutrophils Absolute: 3.5 10*3/uL (ref 1.4–7.0)
Neutrophils: 60 %
Platelets: 172 10*3/uL (ref 150–450)
RBC: 4.6 x10E6/uL (ref 4.14–5.80)
RDW: 12.7 % (ref 11.6–15.4)
WBC: 5.7 10*3/uL (ref 3.4–10.8)

## 2020-12-05 LAB — TSH: TSH: 2.31 u[IU]/mL (ref 0.450–4.500)

## 2020-12-05 LAB — COMPREHENSIVE METABOLIC PANEL
ALT: 22 IU/L (ref 0–44)
AST: 16 IU/L (ref 0–40)
Albumin/Globulin Ratio: 2.9 — ABNORMAL HIGH (ref 1.2–2.2)
Albumin: 4.7 g/dL (ref 3.8–4.8)
Alkaline Phosphatase: 43 IU/L — ABNORMAL LOW (ref 44–121)
BUN/Creatinine Ratio: 16 (ref 10–24)
BUN: 14 mg/dL (ref 8–27)
Bilirubin Total: 0.4 mg/dL (ref 0.0–1.2)
CO2: 23 mmol/L (ref 20–29)
Calcium: 9.5 mg/dL (ref 8.6–10.2)
Chloride: 104 mmol/L (ref 96–106)
Creatinine, Ser: 0.9 mg/dL (ref 0.76–1.27)
Globulin, Total: 1.6 g/dL (ref 1.5–4.5)
Glucose: 102 mg/dL — ABNORMAL HIGH (ref 65–99)
Potassium: 4.6 mmol/L (ref 3.5–5.2)
Sodium: 140 mmol/L (ref 134–144)
Total Protein: 6.3 g/dL (ref 6.0–8.5)
eGFR: 93 mL/min/{1.73_m2} (ref >59)

## 2020-12-05 LAB — HEMOGLOBIN A1C
Est. average glucose Bld gHb Est-mCnc: 120 mg/dL
Hgb A1c MFr Bld: 5.8 % — ABNORMAL HIGH (ref 4.8–5.6)

## 2020-12-05 LAB — LIPID PANEL
Chol/HDL Ratio: 3.6 ratio (ref 0.0–5.0)
Cholesterol, Total: 153 mg/dL (ref 100–199)
HDL: 42 mg/dL (ref >39)
LDL Chol Calc (NIH): 82 mg/dL (ref 0–99)
Triglycerides: 172 mg/dL — ABNORMAL HIGH (ref 0–149)
VLDL Cholesterol Cal: 29 mg/dL (ref 5–40)

## 2020-12-05 LAB — VITAMIN D 25 HYDROXY (VIT D DEFICIENCY, FRACTURES): Vit D, 25-Hydroxy: 46.9 ng/mL (ref 30.0–100.0)

## 2020-12-05 LAB — PSA: Prostate Specific Ag, Serum: 16.3 ng/mL — ABNORMAL HIGH (ref 0.0–4.0)

## 2020-12-08 ENCOUNTER — Encounter: Payer: Self-pay | Admitting: Physician Assistant

## 2020-12-08 ENCOUNTER — Other Ambulatory Visit: Payer: Self-pay

## 2020-12-08 ENCOUNTER — Ambulatory Visit (INDEPENDENT_AMBULATORY_CARE_PROVIDER_SITE_OTHER): Payer: PPO | Admitting: Physician Assistant

## 2020-12-08 VITALS — BP 131/68 | HR 83 | Temp 97.5°F | Ht 69.0 in | Wt 167.4 lb

## 2020-12-08 DIAGNOSIS — Z0000 Encounter for general adult medical examination without abnormal findings: Secondary | ICD-10-CM | POA: Diagnosis not present

## 2020-12-08 DIAGNOSIS — R918 Other nonspecific abnormal finding of lung field: Secondary | ICD-10-CM

## 2020-12-08 DIAGNOSIS — Z122 Encounter for screening for malignant neoplasm of respiratory organs: Secondary | ICD-10-CM | POA: Diagnosis not present

## 2020-12-08 DIAGNOSIS — R7303 Prediabetes: Secondary | ICD-10-CM

## 2020-12-08 DIAGNOSIS — N138 Other obstructive and reflux uropathy: Secondary | ICD-10-CM

## 2020-12-08 DIAGNOSIS — F17210 Nicotine dependence, cigarettes, uncomplicated: Secondary | ICD-10-CM

## 2020-12-08 DIAGNOSIS — R972 Elevated prostate specific antigen [PSA]: Secondary | ICD-10-CM

## 2020-12-08 DIAGNOSIS — N401 Enlarged prostate with lower urinary tract symptoms: Secondary | ICD-10-CM | POA: Diagnosis not present

## 2020-12-08 NOTE — Progress Notes (Signed)
Male physical   Impression and Recommendations:    1. Healthcare maintenance   2. Screening for lung cancer   3. Pulmonary nodules   4. Smoking greater than 30 pack years   5. BPH with obstruction/lower urinary tract symptoms   6. Abnormal PSA   7. Prediabetes      1) Anticipatory Guidance: Discussed skin CA prevention and sunscreen when outside along with skin surveillance; eating a balanced and modest diet; physical activity at least 25 minutes per day or minimum of 150 min/ week moderate to intense activity.  2) Immunizations / Screenings / Labs:   All immunizations are up-to-date per recommendations or will be updated today if pt allows.    - Patient understands with dental and vision screens they will schedule independently.  - Obtained CBC, CMP, HgA1c, Lipid panel, TSH, vit D, PSA when fasting,. Most labs are essentially within normal limits or stable from prior with the exception of PSA and A1c. Patient is followed by Urology for elevated PSA. A1c mildly increased, recommend to reduce simple carbohydrates/glucose intake.  -Will place order for screening low dose chest CT. -   3) Weight:  Continue to improve diet habits to improve overall feelings of well being and objective health data. Improve nutrient density of diet through increasing intake of fruits and vegetables and decreasing saturated fats, white flour products and refined sugars.   4) Healthcare Maintenance: -Continue to follow up with various specialists. -Continue current medication regimen. -Recommend to continue with reduction of simple carbohydrates. -Stay well hydrated. -Follow up in 6 months for HTN, HLD and FBW (lipid panel, cmp, a1c)   Orders Placed This Encounter  Procedures   CT CHEST LUNG CA SCREEN LOW DOSE W/O CM    Standing Status:   Future    Standing Expiration Date:   12/08/2021    Order Specific Question:   Reason for Exam (SYMPTOM  OR DIAGNOSIS REQUIRED)    Answer:   smoking hx of 30+  years    Order Specific Question:   Preferred Imaging Location?    Answer:   [facility]    No orders of the defined types were placed in this encounter.    Return in about 6 months (around 06/10/2021) for HTN, HLD and FBW (lipid panel, cmp, a1c).   Gross side effects, risk and benefits, and alternatives of medications discussed with patient.  Patient is aware that all medications have potential side effects and we are unable to predict every side effect or drug-drug interaction that may occur.  Expresses verbal understanding and consents to current therapy plan and treatment regimen.  Please see AVS handed out to patient at the end of our visit for further patient instructions/ counseling done pertaining to today's office visit.       Subjective:        CC: CPE   HPI: Ryan Beard is a 70 y.o. male who presents to [place] [facility] today for a yearly health maintenance exam.     Health Maintenance Summary  - Reviewed and updated, unless pt declines services.  Last Cologuard or Colonoscopy:   03/28/2013- repeat in 10 years  (Must be 73-75 yo) Tobacco History Reviewed:   yes, former smoker with 10 pack yr hx Abdominal Ultrasound:   N/A  CT scan for screening lung CA:   placed order    Alcohol / drug use:    No concerns, no excessive use / no  use Exercise Habits:   moderate physical activity  Dental Home: yes  Eye exams: yes Dermatology home: yes  Male history: STD concerns:   none Additional penile/ urinary concerns:   Followed by Urology.   Additional concerns beyond Health Maintenance issues:  none    Immunization History  Administered Date(s) Administered   Tdap 12/20/2018     Health Maintenance  Topic Date Due   COVID-19 Vaccine (1) Never done   Zoster Vaccines- Shingrix (1 of 2) Never done   INFLUENZA VACCINE  12/01/2020   PNA vac Low Risk Adult (1 of 2 - PCV13) 12/16/2020 (Originally 08/06/2017)   Fecal DNA  (Cologuard)  01/09/2022   TETANUS/TDAP  12/19/2028   Hepatitis C Screening  Completed   HPV VACCINES  Aged Out       Wt Readings from Last 3 Encounters:  12/08/20 167 lb 6.4 oz (75.9 kg)  06/09/20 172 lb 12.8 oz (78.4 kg)  12/17/19 170 lb 9.6 oz (77.4 kg)   BP Readings from Last 3 Encounters:  12/08/20 131/68  06/09/20 (!) 160/62  12/17/19 (!) 150/66   Pulse Readings from Last 3 Encounters:  12/08/20 83  06/09/20 77  12/17/19 88    Patient Active Problem List   Diagnosis Date Noted   Coronary artery calcification 02/07/2019   Essential hypertension 12/21/2017   Abnormal PSA 08/17/2017   Hyperlipidemia 08/17/2017   Transient elevated blood pressure 08/17/2017   Healthcare maintenance 08/17/2017   Family history of diabetes mellitus in sister 08/17/2017    Past Medical History:  Diagnosis Date   Abnormal PSA    Hyperlipidemia     Past Surgical History:  Procedure Laterality Date   APPENDECTOMY  1973   CARDIAC CATHETERIZATION     PROSTATE BIOPSY     SEPTOPLASTY  1960   TONSILLECTOMY  1960    Family History  Problem Relation Age of Onset   Heart disease Mother    Cancer Father        lymphoma   Hypertension Father    Alcohol abuse Sister    Colon cancer Neg Hx    Esophageal cancer Neg Hx    Rectal cancer Neg Hx    Stomach cancer Neg Hx    Prostate cancer Neg Hx     Social History   Substance and Sexual Activity  Drug Use No  ,  Social History   Substance and Sexual Activity  Alcohol Use Yes   Alcohol/week: 1.0 standard drink   Types: 1 Glasses of wine per week  ,  Social History   Tobacco Use  Smoking Status Former   Packs/day: 1.00   Years: 30.00   Pack years: 30.00   Types: Cigarettes   Quit date: 05/03/2005   Years since quitting: 15.6  Smokeless Tobacco Never  ,  Social History   Substance and Sexual Activity  Sexual Activity Yes   Birth control/protection: None, Post-menopausal    Patient's Medications  New Prescriptions    No medications on file  Previous Medications   ACETYLCYSTEINE (N-ACETYL-L-CYSTEINE PO)    Take 600 mg by mouth daily.   BLACK PEPPER-TURMERIC PO    Take 600 mg by mouth daily.   CHELATED MAGNESIUM PO    Take 1 tablet by mouth daily.   CHOLECALCIFEROL (VITAMIN D-3) 5000 UNITS TABS    Take by mouth daily.   CYANOCOBALAMIN (B-12) 2500 MCG TABS    Take by mouth daily.   KRILL OIL 500 MG CAPS  Take 1 capsule by mouth daily.   L-ARGININE 500 MG TABS    Take 1 tablet by mouth daily.   LISINOPRIL (ZESTRIL) 5 MG TABLET    TAKE 1 TABLET BY MOUTH DAILY   MISC NATURAL PRODUCTS (BETA-SITOSTEROL PLANT STEROLS) CAPS    Take 160 mg by mouth daily.   PRASTERONE, DHEA, (DHEA 50 PO)    Take 1 tablet by mouth daily.   PREGNENOLONE POWD    75 mg by Does not apply route daily.   RESVERATROL 250 MG CAPS    Take by mouth daily.   ROSUVASTATIN (CRESTOR) 20 MG TABLET    TAKE 1 TABLET BY MOUTH DAILY   SAW PALMETTO 160 MG CAPSULE    Take 160 mg by mouth 2 (two) times daily.   TADALAFIL (CIALIS) 5 MG TABLET    Take 5 mg by mouth daily as needed for erectile dysfunction.   UNABLE TO FIND    daily. Med Name: ZMA b6 magnesium and zinc   UNABLE TO FIND    Take 1 tablet by mouth daily. Med Name: Enhanced PQQ with 50 mg Ubiquinol   UNABLE TO FIND    Take 150 mg by mouth daily. Med Name: K2 MK7   UNABLE TO FIND    Med Name: Olipure BP   UNABLE TO FIND    Take 12.5 mg by mouth daily. Med Name: I-Throid Iodine   VITAMIN C (ASCORBIC ACID) 500 MG TABLET    Take 500 mg by mouth daily.   VITAMIN E 180 MG (400 UNITS) CAPSULE    Take 400 Units by mouth daily.  Modified Medications   No medications on file  Discontinued Medications   No medications on file    Patient has no known allergies.  Review of Systems: General:   Denies fever, chills, unexplained weight loss.  Optho/Auditory:   Denies visual changes, blurred vision/LOV Respiratory:   Denies SOB, DOE more than baseline levels.   Cardiovascular:   Denies chest  pain, palpitations, new onset peripheral edema  Gastrointestinal:   Denies nausea, vomiting, diarrhea.  Genitourinary: Denies dysuria, freq/ urgency, flank pain  Endocrine:     Denies hot or cold intolerance, polyuria, polydipsia. Musculoskeletal:   Denies unexplained myalgias, joint swelling, unexplained arthralgias, gait problems.  Skin:  Denies rash, suspicious lesions Neurological:     Denies dizziness, unexplained weakness, numbness  Psychiatric/Behavioral:   Denies mood changes, suicidal or homicidal ideations, hallucinations    Objective:     Blood pressure 131/68, pulse 83, temperature (!) 97.5 F (36.4 C), height '5\' 9"'$  (1.753 m), weight 167 lb 6.4 oz (75.9 kg), SpO2 97 %. Body mass index is 24.72 kg/m. General Appearance:    Alert, cooperative, no distress, appears stated age  Head:    Normocephalic, without obvious abnormality, atraumatic  Eyes:    PERRL, conjunctiva/corneas clear, EOM's intact, fundi    benign, both eyes  Ears:    Normal TM's and external ear canals, both ears  Nose:   Nares normal, septum midline, mucosa normal, no drainage    or sinus tenderness  Throat:   Lips w/o lesion, mucosa moist, and tongue normal; teeth and  gums normal  Neck:   Supple, symmetrical, trachea midline, no adenopathy;    thyroid:  no enlargement/tenderness/nodules; no carotid   bruit or JVD  Back:     Symmetric, no curvature, ROM normal, no CVA tenderness  Lungs:     Clear to auscultation bilaterally, respirations unlabored, no  Wh/ R/  R  Chest Wall:    No tenderness or gross deformity; normal excursion   Heart:    Regular rate and rhythm, S1 and S2 normal, no murmur, rub   or gallop  Abdomen:     Soft, non-tender, bowel sounds active all four quadrants, No G/R/R, no masses, no organomegaly  Genitalia:    Deferred.   Rectal:    Deferred. Followed by Urology.  Extremities:   Extremities normal, atraumatic, no cyanosis or gross edema  Pulses:   2+ and symmetric all extremities   Skin:   Warm, dry, Skin color, texture, turgor normal, no obvious rashes or lesions  M-Sk:   Ambulates * 4 w/o difficulty, no gross deformities, tone WNL  Neurologic:   CNII-XII grossly intact Psych:  No HI/SI, judgement and insight good, Euthymic mood. Full Affect.

## 2020-12-08 NOTE — Patient Instructions (Signed)
Preventive Care 65 Years and Older, Male Preventive care refers to lifestyle choices and visits with your health care provider that can promote health and wellness. This includes: A yearly physical exam. This is also called an annual wellness visit. Regular dental and eye exams. Immunizations. Screening for certain conditions. Healthy lifestyle choices, such as: Eating a healthy diet. Getting regular exercise. Not using drugs or products that contain nicotine and tobacco. Limiting alcohol use. What can I expect for my preventive care visit? Physical exam Your health care provider will check your: Height and weight. These may be used to calculate your BMI (body mass index). BMI is a measurement that tells if you are at a healthy weight. Heart rate and blood pressure. Body temperature. Skin for abnormal spots. Counseling Your health care provider may ask you questions about your: Past medical problems. Family's medical history. Alcohol, tobacco, and drug use. Emotional well-being. Home life and relationship well-being. Sexual activity. Diet, exercise, and sleep habits. History of falls. Memory and ability to understand (cognition). Work and work environment. Access to firearms. What immunizations do I need?  Vaccines are usually given at various ages, according to a schedule. Your health care provider will recommend vaccines for you based on your age, medicalhistory, and lifestyle or other factors, such as travel or where you work. What tests do I need? Blood tests Lipid and cholesterol levels. These may be checked every 5 years, or more often depending on your overall health. Hepatitis C test. Hepatitis B test. Screening Lung cancer screening. You may have this screening every year starting at age 55 if you have a 30-pack-year history of smoking and currently smoke or have quit within the past 15 years. Colorectal cancer screening. All adults should have this screening  starting at age 50 and continuing until age 75. Your health care provider may recommend screening at age 45 if you are at increased risk. You will have tests every 1-10 years, depending on your results and the type of screening test. Prostate cancer screening. Recommendations will vary depending on your family history and other risks. Genital exam to check for testicular cancer or hernias. Diabetes screening. This is done by checking your blood sugar (glucose) after you have not eaten for a while (fasting). You may have this done every 1-3 years. Abdominal aortic aneurysm (AAA) screening. You may need this if you are a current or former smoker. STD (sexually transmitted disease) testing, if you are at risk. Follow these instructions at home: Eating and drinking  Eat a diet that includes fresh fruits and vegetables, whole grains, lean protein, and low-fat dairy products. Limit your intake of foods with high amounts of sugar, saturated fats, and salt. Take vitamin and mineral supplements as recommended by your health care provider. Do not drink alcohol if your health care provider tells you not to drink. If you drink alcohol: Limit how much you have to 0-2 drinks a day. Be aware of how much alcohol is in your drink. In the [place] one drink equals one 12 oz bottle of beer (355 mL), one 5 oz glass of wine (148 mL), or one 1 oz glass of hard liquor (44 mL).  Lifestyle Take daily care of your teeth and gums. Brush your teeth every morning and night with fluoride toothpaste. Floss one time each day. Stay active. Exercise for at least 30 minutes 5 or more days each week. Do not use any products that contain nicotine or tobacco, such as cigarettes, e-cigarettes, and chewing tobacco.   If you need help quitting, ask your health care provider. Do not use drugs. If you are sexually active, practice safe sex. Use a condom or other form of protection to prevent STIs (sexually transmitted infections). Talk  with your health care provider about taking a low-dose aspirin or statin. Find healthy ways to cope with stress, such as: Meditation, yoga, or listening to music. Journaling. Talking to a trusted person. Spending time with friends and family. Safety Always wear your seat belt while driving or riding in a vehicle. Do not drive: If you have been drinking alcohol. Do not ride with someone who has been drinking. When you are tired or distracted. While texting. Wear a helmet and other protective equipment during sports activities. If you have firearms in your house, make sure you follow all gun safety procedures. What's next? Visit your health care provider once a year for an annual wellness visit. Ask your health care provider how often you should have your eyes and teeth checked. Stay up to date on all vaccines. This information is not intended to replace advice given to you by your health care provider. Make sure you discuss any questions you have with your healthcare provider. Document Revised: 01/16/2019 Document Reviewed: 04/13/2018 Elsevier Patient Education  2022 [employer]  

## 2020-12-10 ENCOUNTER — Other Ambulatory Visit (HOSPITAL_BASED_OUTPATIENT_CLINIC_OR_DEPARTMENT_OTHER): Payer: Self-pay | Admitting: Cardiology

## 2020-12-10 NOTE — Telephone Encounter (Signed)
Rx request sent to pharmacy.  

## 2020-12-15 DIAGNOSIS — M6289 Other specified disorders of muscle: Secondary | ICD-10-CM | POA: Diagnosis not present

## 2020-12-15 DIAGNOSIS — S161XXD Strain of muscle, fascia and tendon at neck level, subsequent encounter: Secondary | ICD-10-CM | POA: Diagnosis not present

## 2020-12-15 DIAGNOSIS — M62838 Other muscle spasm: Secondary | ICD-10-CM | POA: Diagnosis not present

## 2020-12-22 DIAGNOSIS — M62838 Other muscle spasm: Secondary | ICD-10-CM | POA: Diagnosis not present

## 2020-12-22 DIAGNOSIS — M6289 Other specified disorders of muscle: Secondary | ICD-10-CM | POA: Diagnosis not present

## 2020-12-22 DIAGNOSIS — S161XXD Strain of muscle, fascia and tendon at neck level, subsequent encounter: Secondary | ICD-10-CM | POA: Diagnosis not present

## 2020-12-29 DIAGNOSIS — M6289 Other specified disorders of muscle: Secondary | ICD-10-CM | POA: Diagnosis not present

## 2020-12-29 DIAGNOSIS — M62838 Other muscle spasm: Secondary | ICD-10-CM | POA: Diagnosis not present

## 2020-12-29 DIAGNOSIS — S161XXD Strain of muscle, fascia and tendon at neck level, subsequent encounter: Secondary | ICD-10-CM | POA: Diagnosis not present

## 2020-12-31 ENCOUNTER — Inpatient Hospital Stay: Admission: RE | Admit: 2020-12-31 | Payer: PPO | Source: Ambulatory Visit

## 2021-01-15 ENCOUNTER — Other Ambulatory Visit: Payer: Self-pay | Admitting: Physician Assistant

## 2021-01-15 DIAGNOSIS — M6289 Other specified disorders of muscle: Secondary | ICD-10-CM | POA: Diagnosis not present

## 2021-01-15 DIAGNOSIS — S161XXD Strain of muscle, fascia and tendon at neck level, subsequent encounter: Secondary | ICD-10-CM | POA: Diagnosis not present

## 2021-01-15 DIAGNOSIS — M62838 Other muscle spasm: Secondary | ICD-10-CM | POA: Diagnosis not present

## 2021-01-19 ENCOUNTER — Ambulatory Visit
Admission: RE | Admit: 2021-01-19 | Discharge: 2021-01-19 | Disposition: A | Discharge status: Home / Self Care | Payer: PPO | Source: Ambulatory Visit | Attending: Physician Assistant | Admitting: Physician Assistant

## 2021-01-19 ENCOUNTER — Other Ambulatory Visit: Payer: Self-pay

## 2021-01-19 DIAGNOSIS — F17210 Nicotine dependence, cigarettes, uncomplicated: Secondary | ICD-10-CM

## 2021-01-19 DIAGNOSIS — Z122 Encounter for screening for malignant neoplasm of respiratory organs: Secondary | ICD-10-CM

## 2021-01-19 DIAGNOSIS — R918 Other nonspecific abnormal finding of lung field: Secondary | ICD-10-CM

## 2021-01-19 NOTE — Progress Notes (Signed)
Cardiology Office Note:    Date:  01/21/2021   ID:  Ryan Beard, DOB Feb 21, 1951, MRN [id number]  PCP:  Lorrene Reid, PA-C  Cardiologist:  Buford Dresser, MD PhD  Referring MD: Lorrene Reid, PA-C   CC: follow up  History of Present Illness:    Beard Ryan is a 70 y.o. male with a hx of hyperlipidemia, distant chest pain who is seen for follow up. He was initially seen as a new consult at the request of Lorrene Reid, PA-C for the evaluation and management of cardiovascular risk on 01/29/2018.  Cardiovascular history and risk: About ~2012 he was having issues with chest pain. Had heart cath at that time (see below) without any obstructive CAD. He has made major lifestyle modifications since that time. No longer a smoker, completely changed diet, highly active. Does high exertion activities like P90X. Also has been spending significant time and research into nutrition and supplements to bolster his lifestyle.   Today: Overall he is feeling pretty good. He denies any recurrent chest pains.  Typically, his resting heart rate is in the 60s at home.  He continues to do well with exercise. Before dinner he does P90X workouts, and after dinner he typically walks 2 miles at a fast pace. Weather permitting, he may walk up to 8 miles two days a week. Also, he is working in his garden and has been chopping down trees.  Usually he takes lisinopril in the morning.  He is a former smoker of 30+ years.  At this time he has not received any Covid vaccinations.  He follows up with his PCP and Urologist every 6 months.  He denies any palpitations, or shortness of breath. No lightheadedness, headaches, syncope, orthopnea, or PND. Also has no lower extremity edema or exertional symptoms.   Past Medical History:  Diagnosis Date   Abnormal PSA    Hyperlipidemia   Prior heart cath in 2007, results below.  Past Surgical History:  Procedure Laterality Date   APPENDECTOMY  1973    CARDIAC CATHETERIZATION     PROSTATE BIOPSY     SEPTOPLASTY  1960   TONSILLECTOMY  1960    Current Medications: Current Outpatient Medications on File Prior to Visit  Medication Sig   Acetylcysteine (N-ACETYL-L-CYSTEINE PO) Take 600 mg by mouth daily.   BLACK PEPPER-TURMERIC PO Take 600 mg by mouth daily.   CHELATED MAGNESIUM PO Take 1 tablet by mouth daily.   Cholecalciferol (VITAMIN D-3) 5000 UNITS TABS Take by mouth daily.   Cyanocobalamin (B-12) 2500 MCG TABS Take by mouth daily.   Krill Oil 500 MG CAPS Take 1 capsule by mouth daily.   L-Arginine 500 MG TABS Take 1 tablet by mouth daily.   lisinopril (ZESTRIL) 5 MG tablet TAKE 1 TABLET BY MOUTH DAILY   Misc Natural Products (BETA-SITOSTEROL PLANT STEROLS) CAPS Take 160 mg by mouth daily.   Prasterone, DHEA, (DHEA 50 PO) Take 1 tablet by mouth daily.   Pregnenolone POWD 75 mg by Does not apply route daily.   Resveratrol 250 MG CAPS Take by mouth daily.   rosuvastatin (CRESTOR) 20 MG tablet Take 1 tablet (20 mg total) by mouth daily. Please keep your upcoming appointment for refills.   saw palmetto 160 MG capsule Take 160 mg by mouth 2 (two) times daily.   tadalafil (CIALIS) 5 MG tablet Take 5 mg by mouth daily as needed for erectile dysfunction.   tadalafil (CIALIS) 5 MG tablet Take 5 mg by mouth daily  as needed for erectile dysfunction.   tamsulosin (FLOMAX) 0.4 MG CAPS capsule Take 0.4 mg by mouth daily as needed.   UNABLE TO FIND daily. Med Name: ZMA b6 magnesium and zinc   UNABLE TO FIND Take 1 tablet by mouth daily. Med Name: Enhanced PQQ with 50 mg Ubiquinol   UNABLE TO FIND Take 150 mg by mouth daily. Med Name: K2 MK7   UNABLE TO FIND Med Name: Olipure BP   UNABLE TO FIND Take 12.5 mg by mouth daily. Med Name: I-Throid Iodine   vitamin C (ASCORBIC ACID) 500 MG tablet Take 500 mg by mouth daily.   vitamin E 180 MG (400 UNITS) capsule Take 400 Units by mouth daily.   No current facility-administered medications on file  prior to visit.     Allergies:   Patient has no known allergies.   Social History   Tobacco Use   Smoking status: Former    Packs/day: 1.00    Years: 30.00    Pack years: 30.00    Types: Cigarettes    Quit date: 05/03/2005    Years since quitting: 15.7   Smokeless tobacco: Never  Vaping Use   Vaping Use: Never used  Substance Use Topics   Alcohol use: Yes    Alcohol/week: 1.0 standard drink    Types: 1 Glasses of wine per week   Drug use: No    Retired Art gallery manager. Extremely active.  Family History: The patient's family history includes Alcohol abuse in his sister; Cancer in his father; Heart disease in his mother; Hypertension in his father. There is no history of Colon cancer, Esophageal cancer, Rectal cancer, Stomach cancer, or Prostate cancer.  ROS:   Please see the history of present illness. All other systems are reviewed and negative.    EKGs/Labs/Other Studies Reviewed:    The following studies were reviewed today:  US Aorta Medicare Screening 11/29/2017: FINDINGS: Abdominal aortic measurements as follows:   Proximal:  2.3 cm   Mid:  1.8 cm   Distal:  0.7 cm   IMPRESSION: No evidence of abdominal aortic aneurysm.  Cath 09/2010  IMPRESSION:  1. Normal left ventricular function with suggestion of mild      angiographic mitral valve prolapse.  2. Mild luminal irregularity without significant obstructive stenoses      with 10-20% narrowings involving the proximal to mid left anterior      descending artery, first diagonal branch of the left anterior      descending artery and right coronary artery.    RECOMMENDATIONS:  Medical therapy.  The patient will be discharged home  later today with plans for follow up with Dr. Gwenlyn Found.  EKG:  EKG is personally reviewed today.   01/21/2021: NSR at 77 bpm, unchanged nonspecific st pattern 02/07/2019: normal sinus rhythm, baseline wander, borderline nonspecific st pattern  Recent Labs: 12/04/2020: ALT 22; BUN  14; Creatinine, Ser 0.90; Hemoglobin 13.7; Platelets 172; Potassium 4.6; Sodium 140; TSH 2.310  Recent Lipid Panel    Component Value Date/Time   CHOL 153 12/04/2020 0910   TRIG 172 (H) 12/04/2020 0910   HDL 42 12/04/2020 0910   CHOLHDL 3.6 12/04/2020 0910   LDLCALC 82 12/04/2020 0910    Physical Exam:    VS:  BP (!) 142/78   Pulse 77   Ht '5\' 9"'$  (1.753 m)   Wt 168 lb (76.2 kg)   BMI 24.81 kg/m     Wt Readings from Last 3 Encounters:  01/21/21 168 lb (  76.2 kg)  12/08/20 167 lb 6.4 oz (75.9 kg)  06/09/20 172 lb 12.8 oz (78.4 kg)    GEN: Well nourished, well developed in no acute distress HEENT: Normal, moist mucous membranes NECK: No JVD CARDIAC: regular rhythm, normal S1 and S2, no rubs or gallops. No murmurs. VASCULAR: Radial and DP pulses 2+ bilaterally. No carotid bruits RESPIRATORY:  Clear to auscultation without rales, wheezing or rhonchi  ABDOMEN: Soft, non-tender, non-distended MUSCULOSKELETAL:  Ambulates independently SKIN: Warm and dry, no edema NEUROLOGIC:  Alert and oriented x 3. No focal neuro deficits noted. PSYCHIATRIC:  Normal affect   ASSESSMENT:    1. Essential hypertension   2. Coronary artery calcification   3. Nonocclusive coronary atherosclerosis of native coronary artery   4. Pure hypercholesterolemia   5. Cardiac risk counseling   6. Counseling on health promotion and disease prevention     PLAN:    CV risk factors: coronary artery calcification, hypercholesterolemia, hypertension: -prior cath 2012 with nonobstructive disease -HTN, lipid control as below -excellent lifestyle -on many supplements, has studied this at length. We have discussed that many of these have not been validated, and supplements are not regulated by the FDA. He understands, doing well on current regimen  Hypertension:  Elevated in office today. Discussed goal of <130/80. He would like to measure home blood pressures, as he feels that his BP tends to be higher in the  office. -continue lisinopril 5 mg. If BP consistently >130/80 at home, would increase to 10 mg lisinopril daily  Hyperlipidemia: -alternates rosuvastatin 20 mg with 10 mg dose daily, feels that he tolerates this better than 20 mg every day -per [facility], lipids 12/04/20 Tchol 153, HDL 42, LDL 82, TG 172 (unknown if fasting) -LDL goal <100 at least or ideally <70  The 10-year ASCVD risk score (Arnett DK, et al., 2019) is: 20.2%   Values used to calculate the score:     Age: 75 years     Sex: Male     Is Non-Hispanic African American: No     Diabetic: No     Tobacco smoker: No     Systolic Blood Pressure: [id number] mmHg     Is BP treated: Yes     HDL Cholesterol: 42 mg/dL     Total Cholesterol: 153 mg/dL   Prevention: -recommend heart healthy/Mediterranean diet, with whole grains, fruits, vegetable, fish, lean meats, nuts, and olive oil. Limit salt. -he is already doing very high level activity, encouraged this. -recommend avoidance of tobacco products. Avoid excess alcohol.  Plan for follow up: 2 years or sooner PRN  Medication Adjustments/Labs and Tests Ordered: Current medicines are reviewed at length with the patient today.  Concerns regarding medicines are outlined above.   Orders Placed This Encounter  Procedures   EKG 12-Lead    No orders of the defined types were placed in this encounter.  Patient Instructions  Medication Instructions:  Your Physician recommend you continue on your current medication as directed.    *If you need a refill on your cardiac medications before your next appointment, please call your pharmacy*   Lab Work: None ordered today   Testing/Procedures: None ordered today   Follow-Up: At [facility], you and your health needs are our priority.  As part of our continuing mission to provide you with exceptional heart care, we have created designated Provider Care Teams.  These Care Teams include your primary Cardiologist (physician) and Advanced  Practice Providers (APPs -  Physician Assistants and Nurse  Practitioners) who all work together to provide you with the care you need, when you need it.  We recommend signing up for the patient portal called "MyChart".  Sign up information is provided on this After Visit Summary.  MyChart is used to connect with patients for Virtual Visits (Telemedicine).  Patients are able to view lab/test results, encounter notes, upcoming appointments, etc.  Non-urgent messages can be sent to your provider as well.   To learn more about what you can do with MyChart, go to [email or web address].    Your next appointment:   2 year(s)  The format for your next appointment:   In Person  Provider:   Buford Dresser, MD   Other Instructions Dr. Harrell Gave recommends you monitor your blood pressure daily. If you notice it's staying consistently greater than 130/80, please call our office.     I,Mathew Stumpf,acting as a Education administrator for [employer], MD.,have documented all relevant documentation on the behalf of Buford Dresser, MD,as directed by  Buford Dresser, MD while in the presence of Buford Dresser, MD.  I, Buford Dresser, MD, have reviewed all documentation for this visit. The documentation on 01/21/21 for the exam, diagnosis, procedures, and orders are all accurate and complete.   Signed, Buford Dresser, MD PhD 01/21/2021 1:57 PM    [facility]

## 2021-01-21 ENCOUNTER — Other Ambulatory Visit: Payer: Self-pay

## 2021-01-21 ENCOUNTER — Ambulatory Visit (HOSPITAL_BASED_OUTPATIENT_CLINIC_OR_DEPARTMENT_OTHER): Payer: PPO | Admitting: Cardiology

## 2021-01-21 ENCOUNTER — Encounter (HOSPITAL_BASED_OUTPATIENT_CLINIC_OR_DEPARTMENT_OTHER): Payer: Self-pay | Admitting: Cardiology

## 2021-01-21 VITALS — BP 142/78 | HR 77 | Ht 69.0 in | Wt 168.0 lb

## 2021-01-21 DIAGNOSIS — I2510 Atherosclerotic heart disease of native coronary artery without angina pectoris: Secondary | ICD-10-CM | POA: Diagnosis not present

## 2021-01-21 DIAGNOSIS — I2584 Coronary atherosclerosis due to calcified coronary lesion: Secondary | ICD-10-CM | POA: Diagnosis not present

## 2021-01-21 DIAGNOSIS — E7800 Pure hypercholesterolemia, unspecified: Secondary | ICD-10-CM | POA: Diagnosis not present

## 2021-01-21 DIAGNOSIS — I10 Essential (primary) hypertension: Secondary | ICD-10-CM | POA: Diagnosis not present

## 2021-01-21 DIAGNOSIS — Z7189 Other specified counseling: Secondary | ICD-10-CM

## 2021-01-21 NOTE — Patient Instructions (Signed)
Medication Instructions:  Your Physician recommend you continue on your current medication as directed.    *If you need a refill on your cardiac medications before your next appointment, please call your pharmacy*   Lab Work: None ordered today   Testing/Procedures: None ordered today   Follow-Up: At [facility], you and your health needs are our priority.  As part of our continuing mission to provide you with exceptional heart care, we have created designated Provider Care Teams.  These Care Teams include your primary Cardiologist (physician) and Advanced Practice Providers (APPs -  Physician Assistants and Nurse Practitioners) who all work together to provide you with the care you need, when you need it.  We recommend signing up for the patient portal called "MyChart".  Sign up information is provided on this After Visit Summary.  MyChart is used to connect with patients for Virtual Visits (Telemedicine).  Patients are able to view lab/test results, encounter notes, upcoming appointments, etc.  Non-urgent messages can be sent to your provider as well.   To learn more about what you can do with MyChart, go to [email or web address].    Your next appointment:   2 year(s)  The format for your next appointment:   In Person  Provider:   Buford Dresser, MD   Other Instructions Dr. Harrell Gave recommends you monitor your blood pressure daily. If you notice it's staying consistently greater than 130/80, please call our office.

## 2021-03-24 DIAGNOSIS — C44329 Squamous cell carcinoma of skin of other parts of face: Secondary | ICD-10-CM | POA: Diagnosis not present

## 2021-03-24 DIAGNOSIS — L578 Other skin changes due to chronic exposure to nonionizing radiation: Secondary | ICD-10-CM | POA: Diagnosis not present

## 2021-03-24 DIAGNOSIS — L821 Other seborrheic keratosis: Secondary | ICD-10-CM | POA: Diagnosis not present

## 2021-03-24 DIAGNOSIS — D485 Neoplasm of uncertain behavior of skin: Secondary | ICD-10-CM | POA: Diagnosis not present

## 2021-04-07 ENCOUNTER — Other Ambulatory Visit (HOSPITAL_BASED_OUTPATIENT_CLINIC_OR_DEPARTMENT_OTHER): Payer: Self-pay | Admitting: Cardiology

## 2021-04-10 DIAGNOSIS — Z79899 Other long term (current) drug therapy: Secondary | ICD-10-CM | POA: Diagnosis not present

## 2021-04-10 DIAGNOSIS — E291 Testicular hypofunction: Secondary | ICD-10-CM | POA: Diagnosis not present

## 2021-04-10 DIAGNOSIS — R972 Elevated prostate specific antigen [PSA]: Secondary | ICD-10-CM | POA: Diagnosis not present

## 2021-04-10 DIAGNOSIS — N401 Enlarged prostate with lower urinary tract symptoms: Secondary | ICD-10-CM | POA: Diagnosis not present

## 2021-04-15 ENCOUNTER — Other Ambulatory Visit: Payer: Self-pay | Admitting: Physician Assistant

## 2021-05-07 DIAGNOSIS — C4492 Squamous cell carcinoma of skin, unspecified: Secondary | ICD-10-CM | POA: Diagnosis not present

## 2021-05-07 DIAGNOSIS — Z481 Encounter for planned postprocedural wound closure: Secondary | ICD-10-CM | POA: Diagnosis not present

## 2021-05-07 DIAGNOSIS — C44329 Squamous cell carcinoma of skin of other parts of face: Secondary | ICD-10-CM | POA: Diagnosis not present

## 2021-06-03 NOTE — Progress Notes (Signed)
Established patient visit   Patient: Ryan Beard   DOB: July 19, 1950   71 y.o. Male  MRN: [id number] Visit Date: 06/10/2021  Chief Complaint  Patient presents with   Follow-up   Hypertension   Hyperlipidemia   Subjective    HPI  Patient presents for follow-up on hypertension and hyperlipidemia.   HTN: Pt denies chest pain, palpitations, dizziness or lower extremity swelling. Taking medication as directed without side effects. Checks BP at home every other day and readings range 130/70-80s.   HLD: Pt reports alternating Crestor 20 mg and 10 mg due to myalgias. Does reports sometimes binge eats on junk food. Patient stays active with outdoor activities.    Medications: Outpatient Medications Prior to Visit  Medication Sig   Acetylcysteine (N-ACETYL-L-CYSTEINE PO) Take 600 mg by mouth daily.   BLACK PEPPER-TURMERIC PO Take 600 mg by mouth daily.   CHELATED MAGNESIUM PO Take 1 tablet by mouth daily.   Cholecalciferol (VITAMIN D-3) 5000 UNITS TABS Take by mouth daily.   Cyanocobalamin (B-12) 2500 MCG TABS Take by mouth daily.   Krill Oil 500 MG CAPS Take 1 capsule by mouth daily.   L-Arginine 500 MG TABS Take 1 tablet by mouth daily.   lisinopril (ZESTRIL) 5 MG tablet TAKE 1 TABLET BY MOUTH DAILY   Misc Natural Products (BETA-SITOSTEROL PLANT STEROLS) CAPS Take 160 mg by mouth daily.   Prasterone, DHEA, (DHEA 50 PO) Take 1 tablet by mouth daily.   Pregnenolone POWD 75 mg by Does not apply route daily.   Resveratrol 250 MG CAPS Take by mouth daily.   rosuvastatin (CRESTOR) 20 MG tablet TAKE 1 TABLET BY MOUTH DAILY   saw palmetto 160 MG capsule Take 160 mg by mouth 2 (two) times daily.   tadalafil (CIALIS) 5 MG tablet Take 5 mg by mouth daily as needed for erectile dysfunction.   tamsulosin (FLOMAX) 0.4 MG CAPS capsule Take 0.4 mg by mouth daily as needed.   UNABLE TO FIND daily. Med Name: ZMA b6 magnesium and zinc   UNABLE TO FIND Take 1 tablet by mouth daily. Med Name: Enhanced  PQQ with 50 mg Ubiquinol   UNABLE TO FIND Take 150 mg by mouth daily. Med Name: K2 MK7   UNABLE TO FIND Med Name: Olipure BP   UNABLE TO FIND Take 12.5 mg by mouth daily. Med Name: I-Throid Iodine   vitamin C (ASCORBIC ACID) 500 MG tablet Take 500 mg by mouth daily.   vitamin E 180 MG (400 UNITS) capsule Take 400 Units by mouth daily.   [DISCONTINUED] tadalafil (CIALIS) 5 MG tablet Take 5 mg by mouth daily as needed for erectile dysfunction.   No facility-administered medications prior to visit.    Review of Systems Review of Systems:  A fourteen system review of systems was performed and found to be positive as per HPI.      Objective    BP 100/70    Pulse 68    Temp (!) 97.5 F (36.4 C)    Ht 5\' 9"  (1.753 m)    Wt 169 lb (76.7 kg)    SpO2 98%    BMI 24.96 kg/m  BP Readings from Last 3 Encounters:  06/10/21 100/70  01/21/21 (!) 142/78  12/08/20 131/68   Wt Readings from Last 3 Encounters:  06/10/21 169 lb (76.7 kg)  01/21/21 168 lb (76.2 kg)  12/08/20 167 lb 6.4 oz (75.9 kg)    Physical Exam  General:  Well Developed, well nourished, appropriate  for stated age.  Neuro:  Alert and oriented,  extra-ocular muscles intact  HEENT:  Normocephalic, atraumatic, neck supple  Skin:  no gross rash, warm, pink. Cardiac:  RRR, S1 S2 Respiratory: CTA B/L  Vascular:  Ext warm, no cyanosis apprec.; cap RF less 2 sec. Psych:  No HI/SI, judgement and insight good, Euthymic mood. Full Affect.   No results found for any visits on 06/10/21.  Assessment & Plan      Problem List Items Addressed This Visit       Cardiovascular and Mediastinum   Essential hypertension - Primary    -BP initially elevated, BP repeated and improved. -Continue current medication regimen. See med list. -Recent CMP, renal function and electrolytes normal. -Will continue to monitor.        Other   Hyperlipidemia (Chronic)    -Discussed recent lipid panel, LDL elevated at 104. Patient reports  difficulty tolerating Crestor 20 mg daily. Advised to continue alternating mg and 10 mg and reduce binge eating episodes. Will repeat lipid panel at follow up visit. Recent liver enzymes normal.       Other Visit Diagnoses     Prediabetes          Prediabetes: -Discussed recent A1c which has improved from 5.8 to 5.6. -Monitor/reduce simple carbohydrates and sugar intake.  Discussed with patient most recent lab results which are essentially within normal limits or stable from prior with the exception of lipid panel.     Return in about 6 months (around 12/08/2021) for CPE and FBW (lipid panel, cmp, a1c).        Lorrene Reid, PA-C  [facility] [phone] (phone) [phone] (fax)  [place]

## 2021-06-08 ENCOUNTER — Other Ambulatory Visit: Payer: PPO

## 2021-06-08 ENCOUNTER — Other Ambulatory Visit: Payer: Self-pay

## 2021-06-08 DIAGNOSIS — Z1321 Encounter for screening for nutritional disorder: Secondary | ICD-10-CM | POA: Diagnosis not present

## 2021-06-08 DIAGNOSIS — E7800 Pure hypercholesterolemia, unspecified: Secondary | ICD-10-CM

## 2021-06-08 DIAGNOSIS — Z130 Encounter for screening for diseases of the blood and blood-forming organs and certain disorders involving the immune mechanism: Secondary | ICD-10-CM | POA: Diagnosis not present

## 2021-06-08 DIAGNOSIS — Z13228 Encounter for screening for other metabolic disorders: Secondary | ICD-10-CM | POA: Diagnosis not present

## 2021-06-08 DIAGNOSIS — Z1329 Encounter for screening for other suspected endocrine disorder: Secondary | ICD-10-CM

## 2021-06-08 DIAGNOSIS — I10 Essential (primary) hypertension: Secondary | ICD-10-CM

## 2021-06-08 DIAGNOSIS — Z8639 Personal history of other endocrine, nutritional and metabolic disease: Secondary | ICD-10-CM | POA: Diagnosis not present

## 2021-06-09 LAB — CBC WITH DIFFERENTIAL/PLATELET
Basophils Absolute: 0 10*3/uL (ref 0.0–0.2)
Basos: 1 %
EOS (ABSOLUTE): 0.3 10*3/uL (ref 0.0–0.4)
Eos: 5 %
Hematocrit: 43 % (ref 37.5–51.0)
Hemoglobin: 14.7 g/dL (ref 13.0–17.7)
Immature Grans (Abs): 0 10*3/uL (ref 0.0–0.1)
Immature Granulocytes: 1 %
Lymphocytes Absolute: 1.7 10*3/uL (ref 0.7–3.1)
Lymphs: 28 %
MCH: 29.8 pg (ref 26.6–33.0)
MCHC: 34.2 g/dL (ref 31.5–35.7)
MCV: 87 fL (ref 79–97)
Monocytes Absolute: 0.5 10*3/uL (ref 0.1–0.9)
Monocytes: 8 %
Neutrophils Absolute: 3.5 10*3/uL (ref 1.4–7.0)
Neutrophils: 57 %
Platelets: 169 10*3/uL (ref 150–450)
RBC: 4.93 x10E6/uL (ref 4.14–5.80)
RDW: 12.7 % (ref 11.6–15.4)
WBC: 6.1 10*3/uL (ref 3.4–10.8)

## 2021-06-09 LAB — VITAMIN D 25 HYDROXY (VIT D DEFICIENCY, FRACTURES): Vit D, 25-Hydroxy: 58.3 ng/mL (ref 30.0–100.0)

## 2021-06-09 LAB — COMPREHENSIVE METABOLIC PANEL
ALT: 26 IU/L (ref 0–44)
AST: 18 IU/L (ref 0–40)
Albumin/Globulin Ratio: 2.6 — ABNORMAL HIGH (ref 1.2–2.2)
Albumin: 4.7 g/dL (ref 3.8–4.8)
Alkaline Phosphatase: 51 IU/L (ref 44–121)
BUN/Creatinine Ratio: 18 (ref 10–24)
BUN: 17 mg/dL (ref 8–27)
Bilirubin Total: 0.3 mg/dL (ref 0.0–1.2)
CO2: 24 mmol/L (ref 20–29)
Calcium: 9.3 mg/dL (ref 8.6–10.2)
Chloride: 102 mmol/L (ref 96–106)
Creatinine, Ser: 0.92 mg/dL (ref 0.76–1.27)
Globulin, Total: 1.8 g/dL (ref 1.5–4.5)
Glucose: 101 mg/dL — ABNORMAL HIGH (ref 70–99)
Potassium: 4.2 mmol/L (ref 3.5–5.2)
Sodium: 142 mmol/L (ref 134–144)
Total Protein: 6.5 g/dL (ref 6.0–8.5)
eGFR: 91 mL/min/{1.73_m2} (ref >59)

## 2021-06-09 LAB — LIPID PANEL
Chol/HDL Ratio: 3.7 ratio (ref 0.0–5.0)
Cholesterol, Total: 178 mg/dL (ref 100–199)
HDL: 48 mg/dL (ref >39)
LDL Chol Calc (NIH): 104 mg/dL — ABNORMAL HIGH (ref 0–99)
Triglycerides: 148 mg/dL (ref 0–149)
VLDL Cholesterol Cal: 26 mg/dL (ref 5–40)

## 2021-06-09 LAB — HEMOGLOBIN A1C
Est. average glucose Bld gHb Est-mCnc: 114 mg/dL
Hgb A1c MFr Bld: 5.6 % (ref 4.8–5.6)

## 2021-06-09 LAB — TSH: TSH: 2.94 u[IU]/mL (ref 0.450–4.500)

## 2021-06-10 ENCOUNTER — Encounter: Payer: Self-pay | Admitting: Physician Assistant

## 2021-06-10 ENCOUNTER — Ambulatory Visit (INDEPENDENT_AMBULATORY_CARE_PROVIDER_SITE_OTHER): Payer: PPO | Admitting: Physician Assistant

## 2021-06-10 ENCOUNTER — Other Ambulatory Visit: Payer: Self-pay

## 2021-06-10 VITALS — BP 100/70 | HR 68 | Temp 97.5°F | Ht 69.0 in | Wt 169.0 lb

## 2021-06-10 DIAGNOSIS — E7800 Pure hypercholesterolemia, unspecified: Secondary | ICD-10-CM | POA: Diagnosis not present

## 2021-06-10 DIAGNOSIS — R7303 Prediabetes: Secondary | ICD-10-CM

## 2021-06-10 DIAGNOSIS — I10 Essential (primary) hypertension: Secondary | ICD-10-CM

## 2021-06-10 NOTE — Assessment & Plan Note (Signed)
-  Discussed recent lipid panel, LDL elevated at 104. Patient reports difficulty tolerating Crestor 20 mg daily. Advised to continue alternating mg and 10 mg and reduce binge eating episodes. Will repeat lipid panel at follow up visit. Recent liver enzymes normal.

## 2021-06-10 NOTE — Patient Instructions (Signed)
Managing Your Hypertension Hypertension, also called high blood pressure, is when the force of the blood pressing against the walls of the arteries is too strong. Arteries are blood vessels that carry blood from your heart throughout your body. Hypertension forces the heart to work harder to pump blood and may cause the arteries to become narrow or stiff. Understanding blood pressure readings Your personal target blood pressure may vary depending on your medical conditions, your age, and other factors. A blood pressure reading includes a higher number over a lower number. Ideally, your blood pressure should be below 120/80. You should know that: The first, or top, number is called the systolic pressure. It is a measure of the pressure in your arteries as your heart beats. The second, or bottom number, is called the diastolic pressure. It is a measure of the pressure in your arteries as the heart relaxes. Blood pressure is classified into four stages. Based on your blood pressure reading, your health care provider may use the following stages to determine what type of treatment you need, if any. Systolic pressure and diastolic pressure are measured in a unit called mmHg. Normal Systolic pressure: below 120. Diastolic pressure: below 80. Elevated Systolic pressure: 120-129. Diastolic pressure: below 80. Hypertension stage 1 Systolic pressure: 130-139. Diastolic pressure: 80-89. Hypertension stage 2 Systolic pressure: 140 or above. Diastolic pressure: 90 or above. How can this condition affect me? Managing your hypertension is an important responsibility. Over time, hypertension can damage the arteries and decrease blood flow to important parts of the body, including the brain, heart, and kidneys. Having untreated or uncontrolled hypertension can lead to: A heart attack. A stroke. A weakened blood vessel (aneurysm). Heart failure. Kidney damage. Eye damage. Metabolic syndrome. Memory and  concentration problems. Vascular dementia. What actions can I take to manage this condition? Hypertension can be managed by making lifestyle changes and possibly by taking medicines. Your health care provider will help you make a plan to bring your blood pressure within a normal range. Nutrition  Eat a diet that is high in fiber and potassium, and low in salt (sodium), added sugar, and fat. An example eating plan is called the Dietary Approaches to Stop Hypertension (DASH) diet. To eat this way: Eat plenty of fresh fruits and vegetables. Try to fill one-half of your plate at each meal with fruits and vegetables. Eat whole grains, such as whole-wheat pasta, brown rice, or whole-grain bread. Fill about one-fourth of your plate with whole grains. Eat low-fat dairy products. Avoid fatty cuts of meat, processed or cured meats, and poultry with skin. Fill about one-fourth of your plate with lean proteins such as fish, chicken without skin, beans, eggs, and tofu. Avoid pre-made and processed foods. These tend to be higher in sodium, added sugar, and fat. Reduce your daily sodium intake. Most people with hypertension should eat less than 1,500 mg of sodium a day. Lifestyle  Work with your health care provider to maintain a healthy body weight or to lose weight. Ask what an ideal weight is for you. Get at least 30 minutes of exercise that causes your heart to beat faster (aerobic exercise) most days of the week. Activities may include walking, swimming, or biking. Include exercise to strengthen your muscles (resistance exercise), such as weight lifting, as part of your weekly exercise routine. Try to do these types of exercises for 30 minutes at least 3 days a week. Do not use any products that contain nicotine or tobacco, such as cigarettes, e-cigarettes,   and chewing tobacco. If you need help quitting, ask your health care provider. Control any long-term (chronic) conditions you have, such as high  cholesterol or diabetes. Identify your sources of stress and find ways to manage stress. This may include meditation, deep breathing, or making time for fun activities. Alcohol use Do not drink alcohol if: Your health care provider tells you not to drink. You are pregnant, may be pregnant, or are planning to become pregnant. If you drink alcohol: Limit how much you use to: 0-1 drink a day for women. 0-2 drinks a day for men. Be aware of how much alcohol is in your drink. In the [place] one drink equals one 12 oz bottle of beer (355 mL), one 5 oz glass of wine (148 mL), or one 1 oz glass of hard liquor (44 mL). Medicines Your health care provider may prescribe medicine if lifestyle changes are not enough to get your blood pressure under control and if: Your systolic blood pressure is 130 or higher. Your diastolic blood pressure is 80 or higher. Take medicines only as told by your health care provider. Follow the directions carefully. Blood pressure medicines must be taken as told by your health care provider. The medicine does not work as well when you skip doses. Skipping doses also puts you at risk for problems. Monitoring Before you monitor your blood pressure: Do not smoke, drink caffeinated beverages, or exercise within 30 minutes before taking a measurement. Use the bathroom and empty your bladder (urinate). Sit quietly for at least 5 minutes before taking measurements. Monitor your blood pressure at home as told by your health care provider. To do this: Sit with your back straight and supported. Place your feet flat on the floor. Do not cross your legs. Support your arm on a flat surface, such as a table. Make sure your upper arm is at heart level. Each time you measure, take two or three readings one minute apart and record the results. You may also need to have your blood pressure checked regularly by your health care provider. General information Talk with your health care  provider about your diet, exercise habits, and other lifestyle factors that may be contributing to hypertension. Review all the medicines you take with your health care provider because there may be side effects or interactions. Keep all visits as told by your health care provider. Your health care provider can help you create and adjust your plan for managing your high blood pressure. Where to find more information National Heart, Lung, and Blood Institute: www.nhlbi.nih.gov American Heart Association: www.heart.org Contact a health care provider if: You think you are having a reaction to medicines you have taken. You have repeated (recurrent) headaches. You feel dizzy. You have swelling in your ankles. You have trouble with your vision. Get help right away if: You develop a severe headache or confusion. You have unusual weakness or numbness, or you feel faint. You have severe pain in your chest or abdomen. You vomit repeatedly. You have trouble breathing. These symptoms may represent a serious problem that is an emergency. Do not wait to see if the symptoms will go away. Get medical help right away. Call your local emergency services (911 in the [place] Do not drive yourself to the hospital. Summary Hypertension is when the force of blood pumping through your arteries is too strong. If this condition is not controlled, it may put you at risk for serious complications. Your personal target blood pressure may vary depending on   your medical conditions, your age, and other factors. For most people, a normal blood pressure is less than 120/80. Hypertension is managed by lifestyle changes, medicines, or both. Lifestyle changes to help manage hypertension include losing weight, eating a healthy, low-sodium diet, exercising more, stopping smoking, and limiting alcohol. This information is not intended to replace advice given to you by your health care provider. Make sure you discuss any questions  you have with your health care provider. Document Revised: 05/07/2019 Document Reviewed: 03/20/2019 Elsevier Patient Education  2022 Elsevier Inc.  

## 2021-06-10 NOTE — Assessment & Plan Note (Signed)
-  BP initially elevated, BP repeated and improved. -Continue current medication regimen. See med list. -Recent CMP, renal function and electrolytes normal. -Will continue to monitor.

## 2021-07-01 DIAGNOSIS — M546 Pain in thoracic spine: Secondary | ICD-10-CM | POA: Diagnosis not present

## 2021-07-01 DIAGNOSIS — M9902 Segmental and somatic dysfunction of thoracic region: Secondary | ICD-10-CM | POA: Diagnosis not present

## 2021-07-01 DIAGNOSIS — M542 Cervicalgia: Secondary | ICD-10-CM | POA: Diagnosis not present

## 2021-07-01 DIAGNOSIS — M62838 Other muscle spasm: Secondary | ICD-10-CM | POA: Diagnosis not present

## 2021-07-01 DIAGNOSIS — M62830 Muscle spasm of back: Secondary | ICD-10-CM | POA: Diagnosis not present

## 2021-07-01 DIAGNOSIS — M9903 Segmental and somatic dysfunction of lumbar region: Secondary | ICD-10-CM | POA: Diagnosis not present

## 2021-07-01 DIAGNOSIS — M545 Low back pain, unspecified: Secondary | ICD-10-CM | POA: Diagnosis not present

## 2021-07-01 DIAGNOSIS — M9901 Segmental and somatic dysfunction of cervical region: Secondary | ICD-10-CM | POA: Diagnosis not present

## 2021-07-02 DIAGNOSIS — M62830 Muscle spasm of back: Secondary | ICD-10-CM | POA: Diagnosis not present

## 2021-07-02 DIAGNOSIS — M9901 Segmental and somatic dysfunction of cervical region: Secondary | ICD-10-CM | POA: Diagnosis not present

## 2021-07-02 DIAGNOSIS — M545 Low back pain, unspecified: Secondary | ICD-10-CM | POA: Diagnosis not present

## 2021-07-02 DIAGNOSIS — M9903 Segmental and somatic dysfunction of lumbar region: Secondary | ICD-10-CM | POA: Diagnosis not present

## 2021-07-02 DIAGNOSIS — M546 Pain in thoracic spine: Secondary | ICD-10-CM | POA: Diagnosis not present

## 2021-07-02 DIAGNOSIS — M542 Cervicalgia: Secondary | ICD-10-CM | POA: Diagnosis not present

## 2021-07-02 DIAGNOSIS — M9902 Segmental and somatic dysfunction of thoracic region: Secondary | ICD-10-CM | POA: Diagnosis not present

## 2021-07-02 DIAGNOSIS — M62838 Other muscle spasm: Secondary | ICD-10-CM | POA: Diagnosis not present

## 2021-07-13 DIAGNOSIS — M546 Pain in thoracic spine: Secondary | ICD-10-CM | POA: Diagnosis not present

## 2021-07-13 DIAGNOSIS — M9902 Segmental and somatic dysfunction of thoracic region: Secondary | ICD-10-CM | POA: Diagnosis not present

## 2021-07-13 DIAGNOSIS — M542 Cervicalgia: Secondary | ICD-10-CM | POA: Diagnosis not present

## 2021-07-13 DIAGNOSIS — M62838 Other muscle spasm: Secondary | ICD-10-CM | POA: Diagnosis not present

## 2021-07-13 DIAGNOSIS — M545 Low back pain, unspecified: Secondary | ICD-10-CM | POA: Diagnosis not present

## 2021-07-13 DIAGNOSIS — M9903 Segmental and somatic dysfunction of lumbar region: Secondary | ICD-10-CM | POA: Diagnosis not present

## 2021-07-13 DIAGNOSIS — M9901 Segmental and somatic dysfunction of cervical region: Secondary | ICD-10-CM | POA: Diagnosis not present

## 2021-07-13 DIAGNOSIS — M62830 Muscle spasm of back: Secondary | ICD-10-CM | POA: Diagnosis not present

## 2021-07-15 DIAGNOSIS — M9902 Segmental and somatic dysfunction of thoracic region: Secondary | ICD-10-CM | POA: Diagnosis not present

## 2021-07-15 DIAGNOSIS — M546 Pain in thoracic spine: Secondary | ICD-10-CM | POA: Diagnosis not present

## 2021-07-15 DIAGNOSIS — M9901 Segmental and somatic dysfunction of cervical region: Secondary | ICD-10-CM | POA: Diagnosis not present

## 2021-07-15 DIAGNOSIS — M542 Cervicalgia: Secondary | ICD-10-CM | POA: Diagnosis not present

## 2021-07-15 DIAGNOSIS — M62838 Other muscle spasm: Secondary | ICD-10-CM | POA: Diagnosis not present

## 2021-07-15 DIAGNOSIS — M62830 Muscle spasm of back: Secondary | ICD-10-CM | POA: Diagnosis not present

## 2021-07-15 DIAGNOSIS — M9903 Segmental and somatic dysfunction of lumbar region: Secondary | ICD-10-CM | POA: Diagnosis not present

## 2021-07-15 DIAGNOSIS — M545 Low back pain, unspecified: Secondary | ICD-10-CM | POA: Diagnosis not present

## 2021-07-16 DIAGNOSIS — M9901 Segmental and somatic dysfunction of cervical region: Secondary | ICD-10-CM | POA: Diagnosis not present

## 2021-07-16 DIAGNOSIS — M62830 Muscle spasm of back: Secondary | ICD-10-CM | POA: Diagnosis not present

## 2021-07-16 DIAGNOSIS — M62838 Other muscle spasm: Secondary | ICD-10-CM | POA: Diagnosis not present

## 2021-07-16 DIAGNOSIS — M545 Low back pain, unspecified: Secondary | ICD-10-CM | POA: Diagnosis not present

## 2021-07-16 DIAGNOSIS — M9903 Segmental and somatic dysfunction of lumbar region: Secondary | ICD-10-CM | POA: Diagnosis not present

## 2021-07-16 DIAGNOSIS — M542 Cervicalgia: Secondary | ICD-10-CM | POA: Diagnosis not present

## 2021-07-16 DIAGNOSIS — M9902 Segmental and somatic dysfunction of thoracic region: Secondary | ICD-10-CM | POA: Diagnosis not present

## 2021-07-16 DIAGNOSIS — M546 Pain in thoracic spine: Secondary | ICD-10-CM | POA: Diagnosis not present

## 2021-07-20 DIAGNOSIS — M62830 Muscle spasm of back: Secondary | ICD-10-CM | POA: Diagnosis not present

## 2021-07-20 DIAGNOSIS — M9902 Segmental and somatic dysfunction of thoracic region: Secondary | ICD-10-CM | POA: Diagnosis not present

## 2021-07-20 DIAGNOSIS — M9901 Segmental and somatic dysfunction of cervical region: Secondary | ICD-10-CM | POA: Diagnosis not present

## 2021-07-20 DIAGNOSIS — M9903 Segmental and somatic dysfunction of lumbar region: Secondary | ICD-10-CM | POA: Diagnosis not present

## 2021-07-20 DIAGNOSIS — M546 Pain in thoracic spine: Secondary | ICD-10-CM | POA: Diagnosis not present

## 2021-07-20 DIAGNOSIS — M62838 Other muscle spasm: Secondary | ICD-10-CM | POA: Diagnosis not present

## 2021-07-20 DIAGNOSIS — M545 Low back pain, unspecified: Secondary | ICD-10-CM | POA: Diagnosis not present

## 2021-07-20 DIAGNOSIS — M542 Cervicalgia: Secondary | ICD-10-CM | POA: Diagnosis not present

## 2021-07-22 DIAGNOSIS — M62830 Muscle spasm of back: Secondary | ICD-10-CM | POA: Diagnosis not present

## 2021-07-22 DIAGNOSIS — M9902 Segmental and somatic dysfunction of thoracic region: Secondary | ICD-10-CM | POA: Diagnosis not present

## 2021-07-22 DIAGNOSIS — M9903 Segmental and somatic dysfunction of lumbar region: Secondary | ICD-10-CM | POA: Diagnosis not present

## 2021-07-22 DIAGNOSIS — M545 Low back pain, unspecified: Secondary | ICD-10-CM | POA: Diagnosis not present

## 2021-07-22 DIAGNOSIS — M546 Pain in thoracic spine: Secondary | ICD-10-CM | POA: Diagnosis not present

## 2021-07-22 DIAGNOSIS — M542 Cervicalgia: Secondary | ICD-10-CM | POA: Diagnosis not present

## 2021-07-22 DIAGNOSIS — M9901 Segmental and somatic dysfunction of cervical region: Secondary | ICD-10-CM | POA: Diagnosis not present

## 2021-07-22 DIAGNOSIS — M62838 Other muscle spasm: Secondary | ICD-10-CM | POA: Diagnosis not present

## 2021-07-23 DIAGNOSIS — M62838 Other muscle spasm: Secondary | ICD-10-CM | POA: Diagnosis not present

## 2021-07-23 DIAGNOSIS — M9903 Segmental and somatic dysfunction of lumbar region: Secondary | ICD-10-CM | POA: Diagnosis not present

## 2021-07-23 DIAGNOSIS — M9901 Segmental and somatic dysfunction of cervical region: Secondary | ICD-10-CM | POA: Diagnosis not present

## 2021-07-23 DIAGNOSIS — M545 Low back pain, unspecified: Secondary | ICD-10-CM | POA: Diagnosis not present

## 2021-07-23 DIAGNOSIS — M546 Pain in thoracic spine: Secondary | ICD-10-CM | POA: Diagnosis not present

## 2021-07-23 DIAGNOSIS — M542 Cervicalgia: Secondary | ICD-10-CM | POA: Diagnosis not present

## 2021-07-23 DIAGNOSIS — M9902 Segmental and somatic dysfunction of thoracic region: Secondary | ICD-10-CM | POA: Diagnosis not present

## 2021-07-23 DIAGNOSIS — M62830 Muscle spasm of back: Secondary | ICD-10-CM | POA: Diagnosis not present

## 2021-07-27 DIAGNOSIS — M9901 Segmental and somatic dysfunction of cervical region: Secondary | ICD-10-CM | POA: Diagnosis not present

## 2021-07-27 DIAGNOSIS — M545 Low back pain, unspecified: Secondary | ICD-10-CM | POA: Diagnosis not present

## 2021-07-27 DIAGNOSIS — M9903 Segmental and somatic dysfunction of lumbar region: Secondary | ICD-10-CM | POA: Diagnosis not present

## 2021-07-27 DIAGNOSIS — M542 Cervicalgia: Secondary | ICD-10-CM | POA: Diagnosis not present

## 2021-07-27 DIAGNOSIS — M9902 Segmental and somatic dysfunction of thoracic region: Secondary | ICD-10-CM | POA: Diagnosis not present

## 2021-07-27 DIAGNOSIS — M62838 Other muscle spasm: Secondary | ICD-10-CM | POA: Diagnosis not present

## 2021-07-27 DIAGNOSIS — M546 Pain in thoracic spine: Secondary | ICD-10-CM | POA: Diagnosis not present

## 2021-07-27 DIAGNOSIS — M62830 Muscle spasm of back: Secondary | ICD-10-CM | POA: Diagnosis not present

## 2021-07-29 DIAGNOSIS — M545 Low back pain, unspecified: Secondary | ICD-10-CM | POA: Diagnosis not present

## 2021-07-29 DIAGNOSIS — M9902 Segmental and somatic dysfunction of thoracic region: Secondary | ICD-10-CM | POA: Diagnosis not present

## 2021-07-29 DIAGNOSIS — M9903 Segmental and somatic dysfunction of lumbar region: Secondary | ICD-10-CM | POA: Diagnosis not present

## 2021-07-29 DIAGNOSIS — M62830 Muscle spasm of back: Secondary | ICD-10-CM | POA: Diagnosis not present

## 2021-07-29 DIAGNOSIS — M546 Pain in thoracic spine: Secondary | ICD-10-CM | POA: Diagnosis not present

## 2021-07-29 DIAGNOSIS — M9901 Segmental and somatic dysfunction of cervical region: Secondary | ICD-10-CM | POA: Diagnosis not present

## 2021-07-29 DIAGNOSIS — M542 Cervicalgia: Secondary | ICD-10-CM | POA: Diagnosis not present

## 2021-07-29 DIAGNOSIS — M62838 Other muscle spasm: Secondary | ICD-10-CM | POA: Diagnosis not present

## 2021-07-30 DIAGNOSIS — M62838 Other muscle spasm: Secondary | ICD-10-CM | POA: Diagnosis not present

## 2021-07-30 DIAGNOSIS — M542 Cervicalgia: Secondary | ICD-10-CM | POA: Diagnosis not present

## 2021-07-30 DIAGNOSIS — M9903 Segmental and somatic dysfunction of lumbar region: Secondary | ICD-10-CM | POA: Diagnosis not present

## 2021-07-30 DIAGNOSIS — M62830 Muscle spasm of back: Secondary | ICD-10-CM | POA: Diagnosis not present

## 2021-07-30 DIAGNOSIS — M545 Low back pain, unspecified: Secondary | ICD-10-CM | POA: Diagnosis not present

## 2021-07-30 DIAGNOSIS — M9901 Segmental and somatic dysfunction of cervical region: Secondary | ICD-10-CM | POA: Diagnosis not present

## 2021-07-30 DIAGNOSIS — M9902 Segmental and somatic dysfunction of thoracic region: Secondary | ICD-10-CM | POA: Diagnosis not present

## 2021-07-30 DIAGNOSIS — M546 Pain in thoracic spine: Secondary | ICD-10-CM | POA: Diagnosis not present

## 2021-08-03 DIAGNOSIS — M9901 Segmental and somatic dysfunction of cervical region: Secondary | ICD-10-CM | POA: Diagnosis not present

## 2021-08-03 DIAGNOSIS — M9903 Segmental and somatic dysfunction of lumbar region: Secondary | ICD-10-CM | POA: Diagnosis not present

## 2021-08-03 DIAGNOSIS — M542 Cervicalgia: Secondary | ICD-10-CM | POA: Diagnosis not present

## 2021-08-03 DIAGNOSIS — M546 Pain in thoracic spine: Secondary | ICD-10-CM | POA: Diagnosis not present

## 2021-08-03 DIAGNOSIS — M62830 Muscle spasm of back: Secondary | ICD-10-CM | POA: Diagnosis not present

## 2021-08-03 DIAGNOSIS — M62838 Other muscle spasm: Secondary | ICD-10-CM | POA: Diagnosis not present

## 2021-08-03 DIAGNOSIS — M9902 Segmental and somatic dysfunction of thoracic region: Secondary | ICD-10-CM | POA: Diagnosis not present

## 2021-08-03 DIAGNOSIS — M545 Low back pain, unspecified: Secondary | ICD-10-CM | POA: Diagnosis not present

## 2021-08-05 DIAGNOSIS — M546 Pain in thoracic spine: Secondary | ICD-10-CM | POA: Diagnosis not present

## 2021-08-05 DIAGNOSIS — M542 Cervicalgia: Secondary | ICD-10-CM | POA: Diagnosis not present

## 2021-08-05 DIAGNOSIS — M545 Low back pain, unspecified: Secondary | ICD-10-CM | POA: Diagnosis not present

## 2021-08-05 DIAGNOSIS — M9902 Segmental and somatic dysfunction of thoracic region: Secondary | ICD-10-CM | POA: Diagnosis not present

## 2021-08-05 DIAGNOSIS — M9901 Segmental and somatic dysfunction of cervical region: Secondary | ICD-10-CM | POA: Diagnosis not present

## 2021-08-05 DIAGNOSIS — M9903 Segmental and somatic dysfunction of lumbar region: Secondary | ICD-10-CM | POA: Diagnosis not present

## 2021-08-05 DIAGNOSIS — M62838 Other muscle spasm: Secondary | ICD-10-CM | POA: Diagnosis not present

## 2021-08-05 DIAGNOSIS — M62830 Muscle spasm of back: Secondary | ICD-10-CM | POA: Diagnosis not present

## 2021-08-28 ENCOUNTER — Other Ambulatory Visit: Payer: Self-pay | Admitting: Physician Assistant

## 2021-10-15 DIAGNOSIS — R972 Elevated prostate specific antigen [PSA]: Secondary | ICD-10-CM | POA: Diagnosis not present

## 2021-10-15 DIAGNOSIS — Z79899 Other long term (current) drug therapy: Secondary | ICD-10-CM | POA: Diagnosis not present

## 2021-10-15 DIAGNOSIS — E291 Testicular hypofunction: Secondary | ICD-10-CM | POA: Diagnosis not present

## 2021-10-15 DIAGNOSIS — N401 Enlarged prostate with lower urinary tract symptoms: Secondary | ICD-10-CM | POA: Diagnosis not present

## 2021-12-07 ENCOUNTER — Other Ambulatory Visit: Payer: PPO

## 2021-12-09 ENCOUNTER — Ambulatory Visit (INDEPENDENT_AMBULATORY_CARE_PROVIDER_SITE_OTHER): Payer: PPO | Admitting: Physician Assistant

## 2021-12-09 ENCOUNTER — Encounter: Payer: Self-pay | Admitting: Physician Assistant

## 2021-12-09 VITALS — BP 120/69 | HR 61 | Ht 68.9 in | Wt 167.4 lb

## 2021-12-09 DIAGNOSIS — Z8639 Personal history of other endocrine, nutritional and metabolic disease: Secondary | ICD-10-CM | POA: Diagnosis not present

## 2021-12-09 DIAGNOSIS — E7800 Pure hypercholesterolemia, unspecified: Secondary | ICD-10-CM | POA: Diagnosis not present

## 2021-12-09 DIAGNOSIS — I10 Essential (primary) hypertension: Secondary | ICD-10-CM

## 2021-12-09 DIAGNOSIS — L738 Other specified follicular disorders: Secondary | ICD-10-CM | POA: Diagnosis not present

## 2021-12-09 DIAGNOSIS — L814 Other melanin hyperpigmentation: Secondary | ICD-10-CM | POA: Diagnosis not present

## 2021-12-09 DIAGNOSIS — R7303 Prediabetes: Secondary | ICD-10-CM

## 2021-12-09 DIAGNOSIS — Z0000 Encounter for general adult medical examination without abnormal findings: Secondary | ICD-10-CM

## 2021-12-09 DIAGNOSIS — D1801 Hemangioma of skin and subcutaneous tissue: Secondary | ICD-10-CM | POA: Diagnosis not present

## 2021-12-09 DIAGNOSIS — L821 Other seborrheic keratosis: Secondary | ICD-10-CM | POA: Diagnosis not present

## 2021-12-09 DIAGNOSIS — Z85828 Personal history of other malignant neoplasm of skin: Secondary | ICD-10-CM | POA: Diagnosis not present

## 2021-12-09 DIAGNOSIS — Z08 Encounter for follow-up examination after completed treatment for malignant neoplasm: Secondary | ICD-10-CM | POA: Diagnosis not present

## 2021-12-09 NOTE — Patient Instructions (Signed)
Preventive Care 65 Years and Older, Male Preventive care refers to lifestyle choices and visits with your health care provider that can promote health and wellness. Preventive care visits are also called wellness exams. What can I expect for my preventive care visit? Counseling During your preventive care visit, your health care provider may ask about your: Medical history, including: Past medical problems. Family medical history. History of falls. Current health, including: Emotional well-being. Home life and relationship well-being. Sexual activity. Memory and ability to understand (cognition). Lifestyle, including: Alcohol, nicotine or tobacco, and drug use. Access to firearms. Diet, exercise, and sleep habits. Work and work environment. Sunscreen use. Safety issues such as seatbelt and bike helmet use. Physical exam Your health care provider will check your: Height and weight. These may be used to calculate your BMI (body mass index). BMI is a measurement that tells if you are at a healthy weight. Waist circumference. This measures the distance around your waistline. This measurement also tells if you are at a healthy weight and may help predict your risk of certain diseases, such as type 2 diabetes and high blood pressure. Heart rate and blood pressure. Body temperature. Skin for abnormal spots. What immunizations do I need?  Vaccines are usually given at various ages, according to a schedule. Your health care provider will recommend vaccines for you based on your age, medical history, and lifestyle or other factors, such as travel or where you work. What tests do I need? Screening Your health care provider may recommend screening tests for certain conditions. This may include: Lipid and cholesterol levels. Diabetes screening. This is done by checking your blood sugar (glucose) after you have not eaten for a while (fasting). Hepatitis C test. Hepatitis B test. HIV (human  immunodeficiency virus) test. STI (sexually transmitted infection) testing, if you are at risk. Lung cancer screening. Colorectal cancer screening. Prostate cancer screening. Abdominal aortic aneurysm (AAA) screening. You may need this if you are a current or former smoker. Talk with your health care provider about your test results, treatment options, and if necessary, the need for more tests. Follow these instructions at home: Eating and drinking  Eat a diet that includes fresh fruits and vegetables, whole grains, lean protein, and low-fat dairy products. Limit your intake of foods with high amounts of sugar, saturated fats, and salt. Take vitamin and mineral supplements as recommended by your health care provider. Do not drink alcohol if your health care provider tells you not to drink. If you drink alcohol: Limit how much you have to 0-2 drinks a day. Know how much alcohol is in your drink. In the [place] one drink equals one 12 oz bottle of beer (355 mL), one 5 oz glass of wine (148 mL), or one 1 oz glass of hard liquor (44 mL). Lifestyle Brush your teeth every morning and night with fluoride toothpaste. Floss one time each day. Exercise for at least 30 minutes 5 or more days each week. Do not use any products that contain nicotine or tobacco. These products include cigarettes, chewing tobacco, and vaping devices, such as e-cigarettes. If you need help quitting, ask your health care provider. Do not use drugs. If you are sexually active, practice safe sex. Use a condom or other form of protection to prevent STIs. Take aspirin only as told by your health care provider. Make sure that you understand how much to take and what form to take. Work with your health care provider to find out whether it is safe   and beneficial for you to take aspirin daily. Ask your health care provider if you need to take a cholesterol-lowering medicine (statin). Find healthy ways to manage stress, such  as: Meditation, yoga, or listening to music. Journaling. Talking to a trusted person. Spending time with friends and family. Safety Always wear your seat belt while driving or riding in a vehicle. Do not drive: If you have been drinking alcohol. Do not ride with someone who has been drinking. When you are tired or distracted. While texting. If you have been using any mind-altering substances or drugs. Wear a helmet and other protective equipment during sports activities. If you have firearms in your house, make sure you follow all gun safety procedures. Minimize exposure to UV radiation to reduce your risk of skin cancer. What's next? Visit your health care provider once a year for an annual wellness visit. Ask your health care provider how often you should have your eyes and teeth checked. Stay up to date on all vaccines. This information is not intended to replace advice given to you by your health care provider. Make sure you discuss any questions you have with your health care provider. Document Revised: 10/15/2020 Document Reviewed: 10/15/2020 Elsevier Patient Education  2023 Elsevier Inc.  

## 2021-12-09 NOTE — Progress Notes (Signed)
Complete physical exam   Patient: Ryan Beard   DOB: 24-Mar-1951   72 y.o. Male  MRN: [id number] Visit Date: 12/09/2021   Chief Complaint  Patient presents with   Annual Exam   Subjective    Ryan Beard is a 71 y.o. male who presents today for a complete physical exam.  He reports consuming a low fat diet.  Brisk walks 2 miles everyday.  He generally feels well. He does not have additional problems to discuss today.    Past Medical History:  Diagnosis Date   Abnormal PSA    Hyperlipidemia    Past Surgical History:  Procedure Laterality Date   APPENDECTOMY  1973   CARDIAC CATHETERIZATION     PROSTATE BIOPSY     SEPTOPLASTY  1960   TONSILLECTOMY  1960   Social History   Socioeconomic History   Marital status: Single    Spouse name: Not on file   Number of children: Not on file   Years of education: Not on file   Highest education level: Not on file  Occupational History   Not on file  Tobacco Use   Smoking status: Former    Packs/day: 1.00    Years: 30.00    Total pack years: 30.00    Types: Cigarettes    Quit date: 05/03/2005    Years since quitting: 16.6   Smokeless tobacco: Never  Vaping Use   Vaping Use: Never used  Substance and Sexual Activity   Alcohol use: Yes    Alcohol/week: 1.0 standard drink of alcohol    Types: 1 Glasses of wine per week   Drug use: No   Sexual activity: Yes    Birth control/protection: None, Post-menopausal  Other Topics Concern   Not on file  Social History Narrative   Not on file   Social Determinants of Health   Financial Resource Strain: Not on file  Food Insecurity: Not on file  Transportation Needs: Not on file  Physical Activity: Not on file  Stress: Not on file  Social Connections: Not on file  Intimate Partner Violence: Not on file     Medications: Outpatient Medications Prior to Visit  Medication Sig   Acetylcysteine (N-ACETYL-L-CYSTEINE PO) Take 600 mg by mouth daily.   BLACK PEPPER-TURMERIC  PO Take 600 mg by mouth daily.   CHELATED MAGNESIUM PO Take 1 tablet by mouth daily.   Cholecalciferol (VITAMIN D-3) 5000 UNITS TABS Take by mouth daily.   Cyanocobalamin (B-12) 2500 MCG TABS Take by mouth daily.   Krill Oil 500 MG CAPS Take 1 capsule by mouth daily.   L-Arginine 500 MG TABS Take 1 tablet by mouth daily.   lisinopril (ZESTRIL) 5 MG tablet TAKE 1 TABLET BY MOUTH DAILY   Misc Natural Products (BETA-SITOSTEROL PLANT STEROLS) CAPS Take 160 mg by mouth daily.   Prasterone, DHEA, (DHEA 50 PO) Take 1 tablet by mouth daily.   Pregnenolone POWD 75 mg by Does not apply route daily.   Resveratrol 250 MG CAPS Take by mouth daily.   rosuvastatin (CRESTOR) 20 MG tablet TAKE 1 TABLET BY MOUTH DAILY   saw palmetto 160 MG capsule Take 160 mg by mouth 2 (two) times daily.   tadalafil (CIALIS) 5 MG tablet Take 5 mg by mouth daily as needed for erectile dysfunction.   tamsulosin (FLOMAX) 0.4 MG CAPS capsule Take 0.4 mg by mouth daily as needed.   UNABLE TO FIND daily. Med Name: ZMA b6 magnesium and zinc  UNABLE TO FIND Take 1 tablet by mouth daily. Med Name: Enhanced PQQ with 50 mg Ubiquinol   UNABLE TO FIND Take 150 mg by mouth daily. Med Name: K2 MK7   UNABLE TO FIND Med Name: Olipure BP   UNABLE TO FIND Take 12.5 mg by mouth daily. Med Name: I-Throid Iodine   vitamin C (ASCORBIC ACID) 500 MG tablet Take 500 mg by mouth daily.   vitamin E 180 MG (400 UNITS) capsule Take 400 Units by mouth daily.   No facility-administered medications prior to visit.    Review of Systems Review of Systems:  A fourteen system review of systems was performed and found to be positive as per HPI.  Last CBC Lab Results  Component Value Date   WBC 6.1 06/08/2021   HGB 14.7 06/08/2021   HCT 43.0 06/08/2021   MCV 87 06/08/2021   MCH 29.8 06/08/2021   RDW 12.7 06/08/2021   PLT 169 94/70/9628   Last metabolic panel Lab Results  Component Value Date   GLUCOSE 101 (H) 06/08/2021   NA 142 06/08/2021    K 4.2 06/08/2021   CL 102 06/08/2021   CO2 24 06/08/2021   BUN 17 06/08/2021   CREATININE 0.92 06/08/2021   EGFR 91 06/08/2021   CALCIUM 9.3 06/08/2021   PROT 6.5 06/08/2021   ALBUMIN 4.7 06/08/2021   LABGLOB 1.8 06/08/2021   AGRATIO 2.6 (H) 06/08/2021   BILITOT 0.3 06/08/2021   ALKPHOS 51 06/08/2021   AST 18 06/08/2021   ALT 26 06/08/2021   Last lipids Lab Results  Component Value Date   CHOL 178 06/08/2021   HDL 48 06/08/2021   LDLCALC 104 (H) 06/08/2021   TRIG 148 06/08/2021   CHOLHDL 3.7 06/08/2021   Last hemoglobin A1c Lab Results  Component Value Date   HGBA1C 5.6 06/08/2021   Last thyroid functions Lab Results  Component Value Date   TSH 2.940 06/08/2021   Last vitamin D Lab Results  Component Value Date   VD25OH 58.3 06/08/2021    Objective     BP 120/69   Pulse 61   Ht 5' 8.9" (1.75 m)   Wt 167 lb 6.4 oz (75.9 kg)   SpO2 98%   BMI 24.79 kg/m  BP Readings from Last 3 Encounters:  12/09/21 120/69  06/10/21 100/70  01/21/21 (!) 142/78   Wt Readings from Last 3 Encounters:  12/09/21 167 lb 6.4 oz (75.9 kg)  06/10/21 169 lb (76.7 kg)  01/21/21 168 lb (76.2 kg)      Physical Exam   General Appearance:    Well developed, well nourished male. Alert, cooperative, in no acute distress, appears stated age  Head:    Normocephalic, without obvious abnormality, atraumatic  Eyes:    PERRL, conjunctiva/corneas clear, EOM's intact, fundi    benign, both eyes       Ears:    Normal TM's and external ear canals, both ears  Nose:   Nares normal, septum midline, mucosa normal, no drainage   or sinus tenderness  Throat:   Lips, mucosa, and tongue normal; teeth and gums normal  Neck:   Supple, symmetrical, trachea midline, no adenopathy;       thyroid:  No enlargement/tenderness/nodules; no carotid   bruit or JVD  Back:     Symmetric, no curvature, ROM normal, no CVA tenderness  Lungs:     Clear to auscultation bilaterally, respirations unlabored  Chest  wall:    No tenderness or deformity  Heart:  Normal heart rate. Normal rhythm. No murmurs, rubs, or gallops.  S1 and S2 normal  Abdomen:     Soft, non-tender, bowel sounds active all four quadrants,    no masses, no organomegaly  Genitalia:    deferred  Rectal:    deferred  Extremities:   All extremities are intact. No cyanosis or edema  Pulses:   2+ and symmetric all extremities  Skin:   Skin color, texture, turgor normal, no rashes or lesions  Lymph nodes:   Cervical and supraclavicular nodes normal  Neurologic:   CNII-XII grossly intact.      Last depression screening scores    06/10/2021    8:52 AM 12/08/2020    9:09 AM 06/09/2020    8:59 AM  PHQ 2/9 Scores  PHQ - 2 Score 0 0 0  PHQ- 9 Score 0 0 0   Last fall risk screening    06/10/2021    8:51 AM  [place] in the past year? 0  Number falls in past yr: 0  Injury with Fall? 0  Risk for fall due to : No Fall Risks  Follow up Falls evaluation completed     No results found for any visits on 12/09/21.  Assessment & Plan    Routine Health Maintenance and Physical Exam  Exercise Activities and Dietary recommendations -Discussed heart healthy diet low in fat and carbohydrates. Recommend moderate exercise 150 mins/wk.  Immunization History  Administered Date(s) Administered   Tdap 12/20/2018    Health Maintenance  Topic Date Due   COVID-19 Vaccine (1) Never done   Zoster Vaccines- Shingrix (1 of 2) Never done   Pneumonia Vaccine 71+ Years old (1 - PCV) Never done   INFLUENZA VACCINE  12/01/2021   Fecal DNA (Cologuard)  01/09/2022   TETANUS/TDAP  12/19/2028   Hepatitis C Screening  Completed   HPV VACCINES  Aged Out   COLONOSCOPY (Pts 45-62yr Insurance coverage will need to be confirmed)  Discontinued    Discussed health benefits of physical activity, and encouraged him to engage in regular exercise appropriate for his age and condition.  Problem List Items Addressed This Visit       Cardiovascular  and Mediastinum   Essential hypertension   Relevant Orders   Comp Met (CMET)   Lipid Profile   HgB A1c   CBC w/Diff     Other   Hyperlipidemia (Chronic)   Relevant Orders   Comp Met (CMET)   Lipid Profile   HgB A1c   CBC w/Diff   Healthcare maintenance - Primary   Relevant Orders   Comp Met (CMET)   Lipid Profile   HgB A1c   CBC w/Diff   Other Visit Diagnoses     Prediabetes       Relevant Orders   Comp Met (CMET)   Lipid Profile   HgB A1c   CBC w/Diff   History of vitamin D deficiency       Relevant Orders   Vitamin D (25 hydroxy)      Will obtain routine fasting labs. UTD Tdap. Due for Cologuard 01/2022. BP at goal. Continue current medication regimen. Follow-up in 6 months for reg OV- HTN, HLD.    Return in about 6 months (around 06/11/2022) for HTN, HLD.       MLorrene Reid PA-C  [phone](phone) [phone](fax)  [place]

## 2021-12-10 LAB — COMPREHENSIVE METABOLIC PANEL
ALT: 22 IU/L (ref 0–44)
AST: 17 IU/L (ref 0–40)
Albumin/Globulin Ratio: 2.9 — ABNORMAL HIGH (ref 1.2–2.2)
Albumin: 4.7 g/dL (ref 3.9–4.9)
Alkaline Phosphatase: 44 IU/L (ref 44–121)
BUN/Creatinine Ratio: 20 (ref 10–24)
BUN: 16 mg/dL (ref 8–27)
Bilirubin Total: 0.4 mg/dL (ref 0.0–1.2)
CO2: 22 mmol/L (ref 20–29)
Calcium: 9.2 mg/dL (ref 8.6–10.2)
Chloride: 102 mmol/L (ref 96–106)
Creatinine, Ser: 0.81 mg/dL (ref 0.76–1.27)
Globulin, Total: 1.6 g/dL (ref 1.5–4.5)
Glucose: 94 mg/dL (ref 70–99)
Potassium: 4.5 mmol/L (ref 3.5–5.2)
Sodium: 137 mmol/L (ref 134–144)
Total Protein: 6.3 g/dL (ref 6.0–8.5)
eGFR: 95 mL/min/{1.73_m2} (ref >59)

## 2021-12-10 LAB — LIPID PANEL
Chol/HDL Ratio: 3.6 ratio (ref 0.0–5.0)
Cholesterol, Total: 167 mg/dL (ref 100–199)
HDL: 47 mg/dL (ref >39)
LDL Chol Calc (NIH): 99 mg/dL (ref 0–99)
Triglycerides: 116 mg/dL (ref 0–149)
VLDL Cholesterol Cal: 21 mg/dL (ref 5–40)

## 2021-12-10 LAB — CBC WITH DIFFERENTIAL/PLATELET
Basophils Absolute: 0 10*3/uL (ref 0.0–0.2)
Basos: 1 %
EOS (ABSOLUTE): 0.3 10*3/uL (ref 0.0–0.4)
Eos: 4 %
Hematocrit: 42 % (ref 37.5–51.0)
Hemoglobin: 14.2 g/dL (ref 13.0–17.7)
Immature Grans (Abs): 0 10*3/uL (ref 0.0–0.1)
Immature Granulocytes: 0 %
Lymphocytes Absolute: 1.7 10*3/uL (ref 0.7–3.1)
Lymphs: 27 %
MCH: 29.5 pg (ref 26.6–33.0)
MCHC: 33.8 g/dL (ref 31.5–35.7)
MCV: 87 fL (ref 79–97)
Monocytes Absolute: 0.5 10*3/uL (ref 0.1–0.9)
Monocytes: 8 %
Neutrophils Absolute: 3.8 10*3/uL (ref 1.4–7.0)
Neutrophils: 60 %
Platelets: 181 10*3/uL (ref 150–450)
RBC: 4.81 x10E6/uL (ref 4.14–5.80)
RDW: 12.6 % (ref 11.6–15.4)
WBC: 6.2 10*3/uL (ref 3.4–10.8)

## 2021-12-10 LAB — VITAMIN D 25 HYDROXY (VIT D DEFICIENCY, FRACTURES): Vit D, 25-Hydroxy: 45.6 ng/mL (ref 30.0–100.0)

## 2021-12-10 LAB — HEMOGLOBIN A1C
Est. average glucose Bld gHb Est-mCnc: 114 mg/dL
Hgb A1c MFr Bld: 5.6 % (ref 4.8–5.6)

## 2022-02-22 ENCOUNTER — Other Ambulatory Visit: Payer: Self-pay | Admitting: Physician Assistant

## 2022-04-14 DIAGNOSIS — N401 Enlarged prostate with lower urinary tract symptoms: Secondary | ICD-10-CM | POA: Diagnosis not present

## 2022-04-14 DIAGNOSIS — Z79899 Other long term (current) drug therapy: Secondary | ICD-10-CM | POA: Diagnosis not present

## 2022-04-14 DIAGNOSIS — R972 Elevated prostate specific antigen [PSA]: Secondary | ICD-10-CM | POA: Diagnosis not present

## 2022-04-14 DIAGNOSIS — E291 Testicular hypofunction: Secondary | ICD-10-CM | POA: Diagnosis not present

## 2022-05-13 DIAGNOSIS — E291 Testicular hypofunction: Secondary | ICD-10-CM | POA: Diagnosis not present

## 2022-05-13 DIAGNOSIS — Z1329 Encounter for screening for other suspected endocrine disorder: Secondary | ICD-10-CM | POA: Diagnosis not present

## 2022-05-13 DIAGNOSIS — R972 Elevated prostate specific antigen [PSA]: Secondary | ICD-10-CM | POA: Diagnosis not present

## 2022-05-18 DIAGNOSIS — R972 Elevated prostate specific antigen [PSA]: Secondary | ICD-10-CM | POA: Diagnosis not present

## 2022-05-18 DIAGNOSIS — N529 Male erectile dysfunction, unspecified: Secondary | ICD-10-CM | POA: Diagnosis not present

## 2022-05-18 DIAGNOSIS — R6882 Decreased libido: Secondary | ICD-10-CM | POA: Diagnosis not present

## 2022-05-18 DIAGNOSIS — E291 Testicular hypofunction: Secondary | ICD-10-CM | POA: Diagnosis not present

## 2022-05-18 DIAGNOSIS — Z6824 Body mass index (BMI) 24.0-24.9, adult: Secondary | ICD-10-CM | POA: Diagnosis not present

## 2022-05-24 ENCOUNTER — Other Ambulatory Visit: Payer: Self-pay

## 2022-05-24 ENCOUNTER — Other Ambulatory Visit (HOSPITAL_BASED_OUTPATIENT_CLINIC_OR_DEPARTMENT_OTHER): Payer: Self-pay | Admitting: *Deleted

## 2022-05-24 ENCOUNTER — Telehealth: Payer: Self-pay | Admitting: Cardiology

## 2022-05-24 MED ORDER — ROSUVASTATIN CALCIUM 20 MG PO TABS: 20.0000 mg | ORAL_TABLET | Freq: Every day | ORAL | 3 refills | Status: DC

## 2022-05-24 NOTE — Telephone Encounter (Signed)
*  STAT* If patient is at the pharmacy, call can be transferred to refill team.   1. Which medications need to be refilled? (please list name of each medication and dose if known) rosuvastatin (CRESTOR) 20 MG tablet   2. Which pharmacy/location (including street and city if local pharmacy) is medication to be sent to?  [place], [place]    3. Do they need a 30 day or 90 day supply? 90 days

## 2022-05-24 NOTE — Telephone Encounter (Signed)
Received fax to refill Crestor, sent as requested

## 2022-05-24 NOTE — Telephone Encounter (Signed)
Rx request sent to pharmacy.  

## 2022-06-09 ENCOUNTER — Ambulatory Visit: Payer: PPO | Admitting: Physician Assistant

## 2022-06-14 DIAGNOSIS — Z08 Encounter for follow-up examination after completed treatment for malignant neoplasm: Secondary | ICD-10-CM | POA: Diagnosis not present

## 2022-06-14 DIAGNOSIS — L814 Other melanin hyperpigmentation: Secondary | ICD-10-CM | POA: Diagnosis not present

## 2022-06-14 DIAGNOSIS — Z85828 Personal history of other malignant neoplasm of skin: Secondary | ICD-10-CM | POA: Diagnosis not present

## 2022-06-14 DIAGNOSIS — D225 Melanocytic nevi of trunk: Secondary | ICD-10-CM | POA: Diagnosis not present

## 2022-06-14 DIAGNOSIS — L821 Other seborrheic keratosis: Secondary | ICD-10-CM | POA: Diagnosis not present

## 2022-07-07 ENCOUNTER — Encounter: Payer: Self-pay | Admitting: Family Medicine

## 2022-07-07 ENCOUNTER — Ambulatory Visit (INDEPENDENT_AMBULATORY_CARE_PROVIDER_SITE_OTHER): Payer: PPO | Admitting: Family Medicine

## 2022-07-07 VITALS — BP 136/75 | HR 63 | Resp 18 | Ht 68.9 in | Wt 167.0 lb

## 2022-07-07 DIAGNOSIS — I2510 Atherosclerotic heart disease of native coronary artery without angina pectoris: Secondary | ICD-10-CM | POA: Diagnosis not present

## 2022-07-07 DIAGNOSIS — I2584 Coronary atherosclerosis due to calcified coronary lesion: Secondary | ICD-10-CM | POA: Diagnosis not present

## 2022-07-07 DIAGNOSIS — I10 Essential (primary) hypertension: Secondary | ICD-10-CM

## 2022-07-07 DIAGNOSIS — E7800 Pure hypercholesterolemia, unspecified: Secondary | ICD-10-CM | POA: Diagnosis not present

## 2022-07-07 NOTE — Patient Instructions (Signed)
Please keep a blood pressure log at home for the next 2 weeks.  If your blood pressure is consistently higher than 130/80, please call the office and let me know.

## 2022-07-07 NOTE — Assessment & Plan Note (Signed)
Collecting lipid panel and CMP for liver enzymes today.  Tolerating rosuvastatin 20 mg daily well with no adverse effects currently.  Encouraged to continue limiting unhealthy fats and sugary foods, maintain current exercise routine.

## 2022-07-07 NOTE — Assessment & Plan Note (Signed)
Followed by cardiology.  Patient knows that he is due for a visit and is going to schedule that in the next couple of months.  At that time, they should likely discuss whether or not to start an anticoagulant.

## 2022-07-07 NOTE — Progress Notes (Signed)
Established Patient Office Visit  Subjective   Patient ID: Ryan Beard, male    DOB: Dec 22, 1950  Age: 72 y.o. MRN: [id number]  Chief Complaint  Patient presents with   Medical Management of Chronic Issues    Fasting    Hyperlipidemia   Hypertension    HPI [name] is a 72 y.o. male presenting today for follow up of hypertension, hyperlipidemia. Hypertension: Patient here for follow-up of elevated blood pressure. He is exercising walking at least 2 miles a day and is adherent to low salt diet.   Pt denies chest pain, SOB, dizziness, edema, syncope, fatigue or heart palpitations.  Taking blood pressure at home, has been running around 125/80.  Taking lisinopril, reports excellent compliance with treatment. Denies side effects. Hyperlipidemia: tolerating rosuvastatin well with no myalgias or significant side effects. Currently consuming a general diet high in fruits and vegetables, many of which he grows himself. Home exercise routine includes walking at least 2 miles a day. The 10-year ASCVD risk score (Arnett DK, et al., 2019) is: 20.2%  ROS Negative unless otherwise noted in HPI   Objective:     BP 136/75 (BP Location: Left Arm, Patient Position: Bed low/side rails up, Cuff Size: Normal)   Pulse 63   Resp 18   Ht 5' 8.9" (1.75 m)   Wt 167 lb (75.8 kg)   SpO2 99%   BMI 24.73 kg/m   Physical Exam Constitutional:      General: He is not in acute distress.    Appearance: Normal appearance.  HENT:     Head: Normocephalic and atraumatic.  Cardiovascular:     Rate and Rhythm: Normal rate and regular rhythm.     Heart sounds: Normal heart sounds. No murmur heard.    No friction rub. No gallop.  Pulmonary:     Effort: Pulmonary effort is normal.     Breath sounds: Normal breath sounds.  Skin:    General: Skin is warm and dry.  Neurological:     Mental Status: He is alert and oriented to person, place, and time.  Psychiatric:        Mood and Affect: Mood normal.      Assessment & Plan:  Essential hypertension Assessment & Plan: Stable.  Blood pressure initially elevated upon presentation at 145/83, on repeat improved to 136/75 which is still slightly above the goal.  Blood pressure at home running around [id number] systolic which is within the goal.  Recommended continuing ambulatory blood pressure monitoring over the next 2 weeks and call the office if blood pressures consistently more than 130/80.  Encouraged to continue regular exercise and maintain low sodium intake.  Patient expressed understanding and is agreeable to this plan.  Collecting CMP today for medication monitoring.  Orders: -     CBC with Differential/Platelet; Future -     Comprehensive metabolic panel; Future  Pure hypercholesterolemia Assessment & Plan: Collecting lipid panel and CMP for liver enzymes today.  Tolerating rosuvastatin 20 mg daily well with no adverse effects currently.  Encouraged to continue limiting unhealthy fats and sugary foods, maintain current exercise routine.  Orders: -     Lipid panel; Future  Coronary artery calcification Assessment & Plan: Followed by cardiology.  Patient knows that he is due for a visit and is going to schedule that in the next couple of months.  At that time, they should likely discuss whether or not to start an anticoagulant.   Patient does not need any  refills today.  Return in about 6 months (around 01/07/2023) for Medicare annual wellness visit, fasting blood work 1 week before.    Velva Harman, PA

## 2022-07-07 NOTE — Assessment & Plan Note (Addendum)
Stable.  Blood pressure initially elevated upon presentation at 145/83, on repeat improved to 136/75 which is still slightly above the goal.  Blood pressure at home running around Q000111Q systolic which is within the goal.  Recommended continuing ambulatory blood pressure monitoring over the next 2 weeks and call the office if blood pressures consistently more than 130/80.  Encouraged to continue regular exercise and maintain low sodium intake.  Patient expressed understanding and is agreeable to this plan.  Collecting CMP today for medication monitoring.

## 2022-07-08 LAB — CBC WITH DIFFERENTIAL/PLATELET
Basophils Absolute: 0 10*3/uL (ref 0.0–0.2)
Basos: 1 %
EOS (ABSOLUTE): 0.2 10*3/uL (ref 0.0–0.4)
Eos: 3 %
Hematocrit: 42.1 % (ref 37.5–51.0)
Hemoglobin: 14.3 g/dL (ref 13.0–17.7)
Immature Grans (Abs): 0 10*3/uL (ref 0.0–0.1)
Immature Granulocytes: 0 %
Lymphocytes Absolute: 1.8 10*3/uL (ref 0.7–3.1)
Lymphs: 31 %
MCH: 29.5 pg (ref 26.6–33.0)
MCHC: 34 g/dL (ref 31.5–35.7)
MCV: 87 fL (ref 79–97)
Monocytes Absolute: 0.5 10*3/uL (ref 0.1–0.9)
Monocytes: 8 %
Neutrophils Absolute: 3.3 10*3/uL (ref 1.4–7.0)
Neutrophils: 57 %
Platelets: 171 10*3/uL (ref 150–450)
RBC: 4.84 x10E6/uL (ref 4.14–5.80)
RDW: 12.8 % (ref 11.6–15.4)
WBC: 5.8 10*3/uL (ref 3.4–10.8)

## 2022-07-08 LAB — LIPID PANEL
Chol/HDL Ratio: 3.5 ratio (ref 0.0–5.0)
Cholesterol, Total: 170 mg/dL (ref 100–199)
HDL: 49 mg/dL (ref >39)
LDL Chol Calc (NIH): 98 mg/dL (ref 0–99)
Triglycerides: 128 mg/dL (ref 0–149)
VLDL Cholesterol Cal: 23 mg/dL (ref 5–40)

## 2022-07-08 LAB — COMPREHENSIVE METABOLIC PANEL
ALT: 24 IU/L (ref 0–44)
AST: 20 IU/L (ref 0–40)
Albumin/Globulin Ratio: 2.4 — ABNORMAL HIGH (ref 1.2–2.2)
Albumin: 4.6 g/dL (ref 3.9–4.9)
Alkaline Phosphatase: 48 IU/L (ref 44–121)
BUN/Creatinine Ratio: 15 (ref 10–24)
BUN: 15 mg/dL (ref 8–27)
Bilirubin Total: 0.4 mg/dL (ref 0.0–1.2)
CO2: 21 mmol/L (ref 20–29)
Calcium: 9.2 mg/dL (ref 8.6–10.2)
Chloride: 102 mmol/L (ref 96–106)
Creatinine, Ser: 0.97 mg/dL (ref 0.76–1.27)
Globulin, Total: 1.9 g/dL (ref 1.5–4.5)
Glucose: 100 mg/dL — ABNORMAL HIGH (ref 70–99)
Potassium: 4.4 mmol/L (ref 3.5–5.2)
Sodium: 139 mmol/L (ref 134–144)
Total Protein: 6.5 g/dL (ref 6.0–8.5)
eGFR: 85 mL/min/{1.73_m2} (ref >59)

## 2022-07-12 DIAGNOSIS — R972 Elevated prostate specific antigen [PSA]: Secondary | ICD-10-CM | POA: Diagnosis not present

## 2022-07-12 DIAGNOSIS — E291 Testicular hypofunction: Secondary | ICD-10-CM | POA: Diagnosis not present

## 2022-07-14 DIAGNOSIS — N529 Male erectile dysfunction, unspecified: Secondary | ICD-10-CM | POA: Diagnosis not present

## 2022-07-14 DIAGNOSIS — R972 Elevated prostate specific antigen [PSA]: Secondary | ICD-10-CM | POA: Diagnosis not present

## 2022-07-14 DIAGNOSIS — Z6824 Body mass index (BMI) 24.0-24.9, adult: Secondary | ICD-10-CM | POA: Diagnosis not present

## 2022-07-14 DIAGNOSIS — E291 Testicular hypofunction: Secondary | ICD-10-CM | POA: Diagnosis not present

## 2022-07-16 IMAGING — CT CT CHEST LUNG CANCER SCREENING LOW DOSE W/O CM
1 of 3 series · 9 of 40 positions shown, 12 images · non-contrast
Comparison: Chest CT dated December 27, 2018

CLINICAL DATA: Current smoker with 30 pack-year history

EXAM:
CT CHEST WITHOUT CONTRAST LOW-DOSE FOR LUNG CANCER SCREENING
TECHNIQUE: Multidetector CT imaging of the chest was performed following the
standard protocol without IV contrast.

[ct lung segmentation data · axial · 0.74mm/px · z∈[-374,-374]mm · 9 of 336 frames shown]
[frame 1/336  mediastinal]
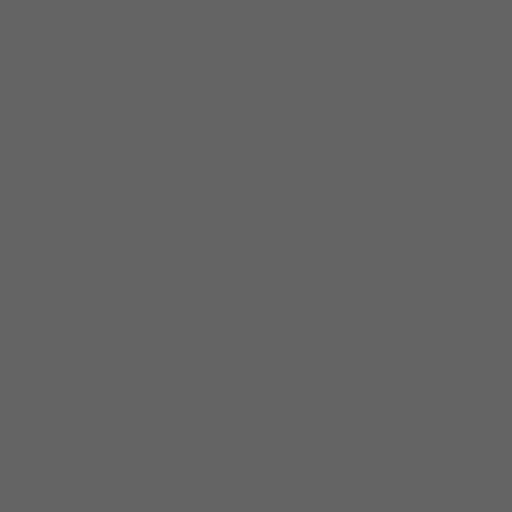
[frame 1/336  lung]
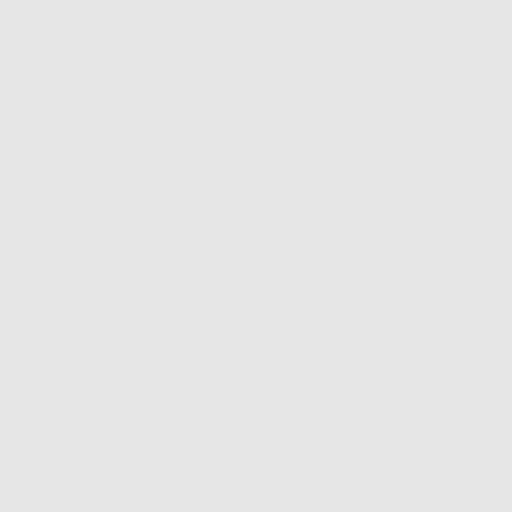
[frame 38/336  lung]
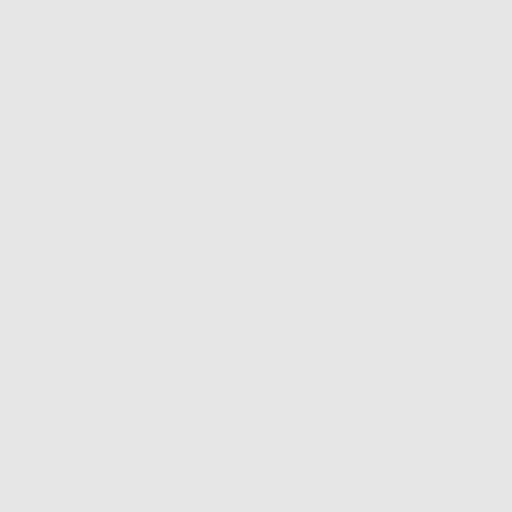
[frame 75/336  lung]
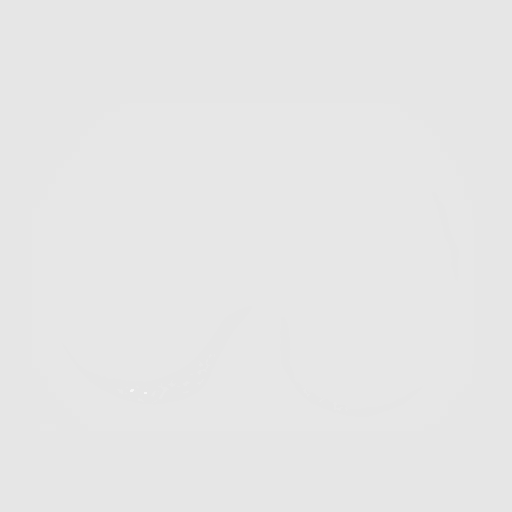
[frame 112/336  lung]
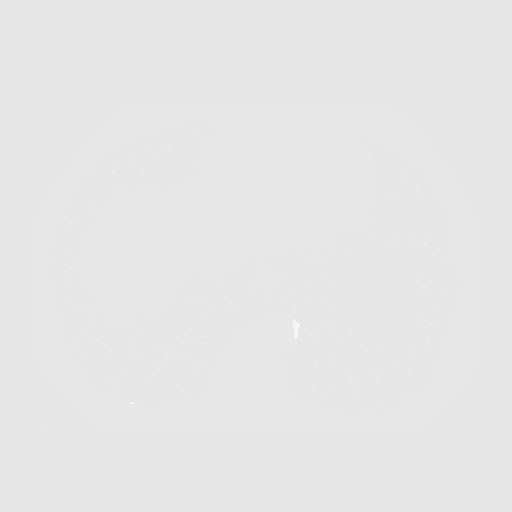
[frame 149/336  mediastinal]
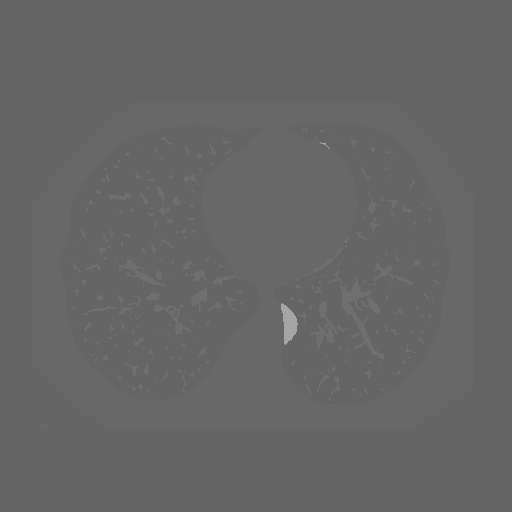
[frame 149/336  lung]
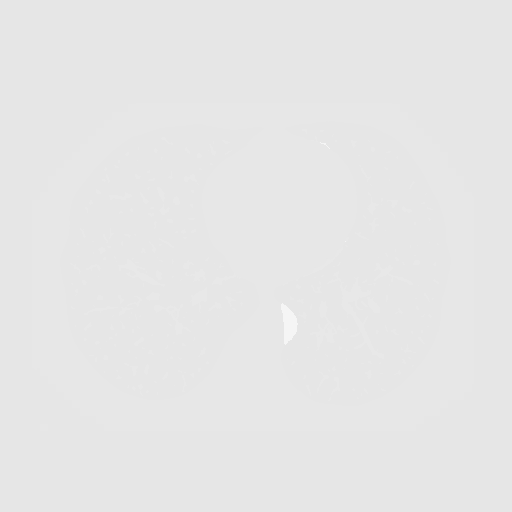
[frame 187/336  lung]
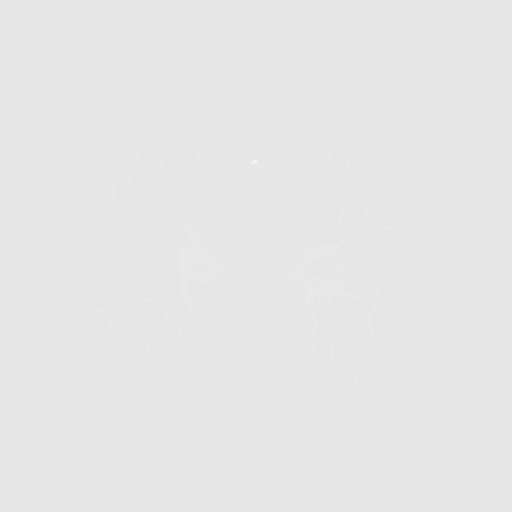
[frame 224/336  lung]
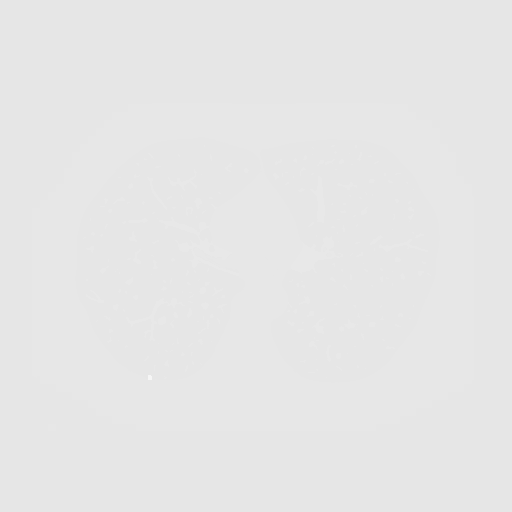
[frame 261/336  lung]
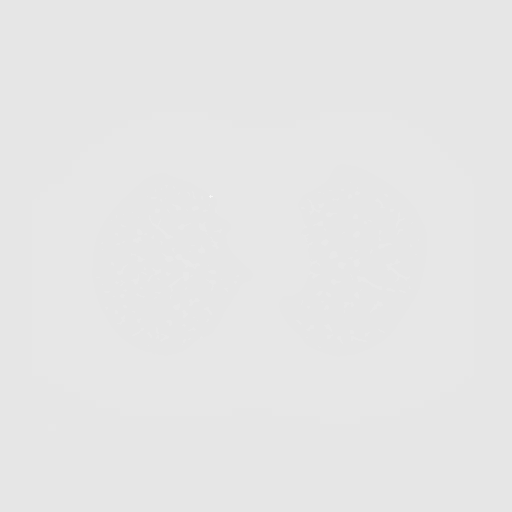
[frame 298/336  mediastinal]
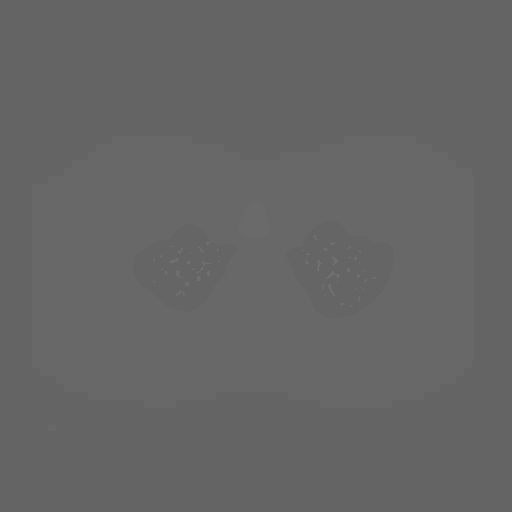
[frame 298/336  lung]
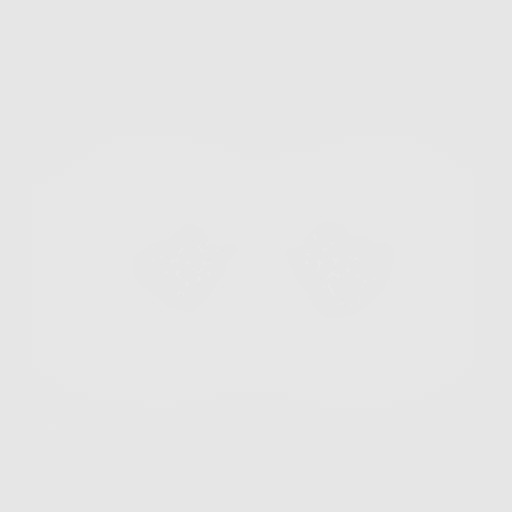

[9 of 40 positions shown; findings below may reference images not displayed]

FINDINGS: Cardiovascular: Normal heart size. No pericardial effusion.
Calcifications of the LAD. Mild calcifications of the thoracic
aorta.

Mediastinum/Nodes: Small hiatal hernia no pathologically enlarged
lymph nodes seen in the chest.

Lungs/Pleura: Central airways are patent. Centrilobular emphysema.
No consolidation, pleural effusion or pneumothorax.

Upper Abdomen: Partially imaged exophytic lesion of the left kidney,
likely simple cysts left renal parenchymal calcification. No acute
findings

Musculoskeletal: No chest wall mass or suspicious bone lesions
identified.
IMPRESSION: Lung-RADS 1, negative. Continue annual screening with low-dose chest
CT without contrast in 12 months.

Coronary artery calcifications, aortic Atherosclerosis (SSD1R-MKL.L)
and Emphysema (SSD1R-QVE.A).

## 2022-08-11 ENCOUNTER — Other Ambulatory Visit: Payer: Self-pay

## 2022-08-11 DIAGNOSIS — I10 Essential (primary) hypertension: Secondary | ICD-10-CM

## 2022-08-11 MED ORDER — LISINOPRIL 5 MG PO TABS: 5.0000 mg | ORAL_TABLET | Freq: Every day | ORAL | 1 refills | Status: DC

## 2022-10-11 DIAGNOSIS — E291 Testicular hypofunction: Secondary | ICD-10-CM | POA: Diagnosis not present

## 2022-10-11 DIAGNOSIS — R972 Elevated prostate specific antigen [PSA]: Secondary | ICD-10-CM | POA: Diagnosis not present

## 2022-10-13 DIAGNOSIS — R972 Elevated prostate specific antigen [PSA]: Secondary | ICD-10-CM | POA: Diagnosis not present

## 2022-10-13 DIAGNOSIS — R6882 Decreased libido: Secondary | ICD-10-CM | POA: Diagnosis not present

## 2022-10-13 DIAGNOSIS — E291 Testicular hypofunction: Secondary | ICD-10-CM | POA: Diagnosis not present

## 2022-10-13 DIAGNOSIS — Z6823 Body mass index (BMI) 23.0-23.9, adult: Secondary | ICD-10-CM | POA: Diagnosis not present

## 2022-10-13 DIAGNOSIS — M2550 Pain in unspecified joint: Secondary | ICD-10-CM | POA: Diagnosis not present

## 2022-10-13 DIAGNOSIS — N529 Male erectile dysfunction, unspecified: Secondary | ICD-10-CM | POA: Diagnosis not present

## 2022-10-20 DIAGNOSIS — R972 Elevated prostate specific antigen [PSA]: Secondary | ICD-10-CM | POA: Diagnosis not present

## 2022-10-20 DIAGNOSIS — E291 Testicular hypofunction: Secondary | ICD-10-CM | POA: Diagnosis not present

## 2022-10-20 DIAGNOSIS — Z79899 Other long term (current) drug therapy: Secondary | ICD-10-CM | POA: Diagnosis not present

## 2022-10-20 DIAGNOSIS — N401 Enlarged prostate with lower urinary tract symptoms: Secondary | ICD-10-CM | POA: Diagnosis not present

## 2022-10-22 ENCOUNTER — Other Ambulatory Visit: Payer: Self-pay | Admitting: Family Medicine

## 2022-10-22 DIAGNOSIS — R972 Elevated prostate specific antigen [PSA]: Secondary | ICD-10-CM

## 2022-10-22 DIAGNOSIS — E291 Testicular hypofunction: Secondary | ICD-10-CM

## 2022-12-04 ENCOUNTER — Emergency Department (HOSPITAL_BASED_OUTPATIENT_CLINIC_OR_DEPARTMENT_OTHER): Payer: PPO

## 2022-12-04 ENCOUNTER — Other Ambulatory Visit: Payer: Self-pay

## 2022-12-04 ENCOUNTER — Encounter (HOSPITAL_BASED_OUTPATIENT_CLINIC_OR_DEPARTMENT_OTHER): Payer: Self-pay

## 2022-12-04 ENCOUNTER — Emergency Department (HOSPITAL_BASED_OUTPATIENT_CLINIC_OR_DEPARTMENT_OTHER)
Admission: EM | Admit: 2022-12-04 | Discharge: 2022-12-04 | Disposition: A | Discharge status: Home / Self Care | Payer: PPO | Attending: Emergency Medicine | Admitting: Emergency Medicine

## 2022-12-04 DIAGNOSIS — N400 Enlarged prostate without lower urinary tract symptoms: Secondary | ICD-10-CM | POA: Diagnosis not present

## 2022-12-04 DIAGNOSIS — R319 Hematuria, unspecified: Secondary | ICD-10-CM

## 2022-12-04 DIAGNOSIS — R109 Unspecified abdominal pain: Secondary | ICD-10-CM | POA: Diagnosis not present

## 2022-12-04 DIAGNOSIS — N2889 Other specified disorders of kidney and ureter: Secondary | ICD-10-CM | POA: Diagnosis not present

## 2022-12-04 DIAGNOSIS — K5790 Diverticulosis of intestine, part unspecified, without perforation or abscess without bleeding: Secondary | ICD-10-CM | POA: Diagnosis not present

## 2022-12-04 LAB — URINALYSIS, ROUTINE W REFLEX MICROSCOPIC
Bilirubin Urine: NEGATIVE
Glucose, UA: NEGATIVE mg/dL
Ketones, ur: NEGATIVE mg/dL
Nitrite: NEGATIVE
Protein, ur: 100 mg/dL — AB
RBC / HPF: >50 RBC/hpf (ref 0–5)
Specific Gravity, Urine: 1.006 (ref 1.005–1.030)
pH: 6.5 (ref 5.0–8.0)

## 2022-12-04 NOTE — ED Notes (Signed)
Pt d/c home per MD order. Discharge summary reviewed, pt verbalizes understanding. Ambulatory off unit. No s/s of acute distress noted at discharge.  °

## 2022-12-04 NOTE — ED Provider Notes (Signed)
[facility] EMERGENCY DEPARTMENT AT [facility] Provider Note   CSN: [id number] Arrival date & time: 12/04/22  1059     History  Chief Complaint  Patient presents with   Hematuria   Flank Pain    right    Ryan Beard is a 72 y.o. male presenting to the complaint of hematuria.  Patient ports noted dark bloody urine after coughing yesterday.  He drank a lot of fluid and the urine got lighter, then got bloody again this morning.  He reports he had a mild ache in his right lower back earlier but feels that this is simply from golfing.  Denies history of kidney stones or hematuria.  He reports he does have an enlarged prostate for which he sees a urologist as an outpatient.  He denies smoking or any history of malignancy.  He is not on anticoagulation  HPI     Home Medications Prior to Admission medications   Medication Sig Start Date End Date Taking? Authorizing Provider  Acetylcysteine (N-ACETYL-L-CYSTEINE PO) Take 600 mg by mouth daily.    [provider]  BLACK PEPPER-TURMERIC PO Take 600 mg by mouth daily.    [provider]  CHELATED MAGNESIUM PO Take 1 tablet by mouth daily.    [provider]  Cholecalciferol (VITAMIN D-3) 5000 UNITS TABS Take by mouth daily.    [provider]  Cyanocobalamin (B-12) 2500 MCG TABS Take by mouth daily.    [provider]  [place] Lanius 500 MG CAPS Take 1 capsule by mouth daily.    [provider]  L-Arginine 500 MG TABS Take 1 tablet by mouth daily.    [provider]  lisinopril (ZESTRIL) 5 MG tablet Take 1 tablet (5 mg total) by mouth daily. 08/11/22   Melida Quitter, PA  Pregnenolone POWD 75 mg by Does not apply route daily.    [provider]  rosuvastatin (CRESTOR) 20 MG tablet Take 1 tablet (20 mg total) by mouth daily. 05/24/22   Jodelle Red, MD  saw palmetto 160 MG capsule Take 160 mg by mouth 2 (two) times daily.    [provider]   tadalafil (CIALIS) 5 MG tablet Take 5 mg by mouth daily as needed for erectile dysfunction.    [provider]  tamsulosin (FLOMAX) 0.4 MG CAPS capsule Take 0.4 mg by mouth daily as needed.    [provider]  UNABLE TO FIND daily. Med Name: ZMA b6 magnesium and zinc    [provider]  UNABLE TO FIND Take 150 mg by mouth daily. Med Name: K2 MK7    [provider]  UNABLE TO FIND Med Name: Olipure BP    [provider]  UNABLE TO FIND Take 12.5 mg by mouth daily. Med Name: I-Throid Iodine    [provider]  vitamin C (ASCORBIC ACID) 500 MG tablet Take 500 mg by mouth daily.    [provider]  vitamin E 180 MG (400 UNITS) capsule Take 400 Units by mouth daily.    [provider]      Allergies    Patient has no known allergies.    Review of Systems   Review of Systems  Physical Exam Updated Vital Signs BP (!) 157/68   Pulse 61   Temp 98.6 F (37 C)   Resp 18   Ht 5\' 9"  (1.753 m)   Wt 74.8 kg   SpO2 100%   BMI 24.37 kg/m  Physical  Exam Constitutional:      General: He is not in acute distress. HENT:     Head: Normocephalic and atraumatic.  Eyes:     Conjunctiva/sclera: Conjunctivae normal.     Pupils: Pupils are equal, round, and reactive to light.  Cardiovascular:     Rate and Rhythm: Normal rate and regular rhythm.  Pulmonary:     Effort: Pulmonary effort is normal. No respiratory distress.  Abdominal:     General: There is no distension.     Tenderness: There is no abdominal tenderness.  Skin:    General: Skin is warm and dry.  Neurological:     General: No focal deficit present.     Mental Status: He is alert. Mental status is at baseline.  Psychiatric:        Mood and Affect: Mood normal.        Behavior: Behavior normal.     ED Results / Procedures / Treatments   Labs (all labs ordered are listed, but only abnormal results are displayed) Labs Reviewed  URINALYSIS, ROUTINE W REFLEX  MICROSCOPIC - Abnormal; Notable for the following components:      Result Value   Color, Urine BROWN (*)    APPearance HAZY (*)    Hgb urine dipstick LARGE (*)    Protein, ur 100 (*)    Leukocytes,Ua TRACE (*)    Bacteria, UA RARE (*)    All other components within normal limits    EKG None  Radiology CT Renal Stone Study  Result Date: 12/04/2022 CLINICAL DATA:  Abdominal/flank pain, stone suspected EXAM: CT ABDOMEN AND PELVIS WITHOUT CONTRAST TECHNIQUE: Multidetector CT imaging of the abdomen and pelvis was performed following the standard protocol without IV contrast. RADIATION DOSE REDUCTION: This exam was performed according to the departmental dose-optimization program which includes automated exposure control, adjustment of the mA and/or kV according to patient size and/or use of iterative reconstruction technique. COMPARISON:  None Available. FINDINGS: Lower chest: Lung bases are clear. Assessment of the abdominal and pelvic solid organs is limited in the absence of IV contrast. Hepatobiliary: Liver has a normal contour. No focal liver lesions are visualized. No perihepatic fluid. No evidence of cholelithiasis or cholecystitis. Pancreas: No evidence of peripancreatic fat stranding to suggest pancreatitis. Spleen: Normal in size without focal abnormality. There is a small splenule adjacent to the spleen. Adrenals/Urinary Tract: Bilateral adrenal glands are normal in appearance. There is an indeterminate 2.8 x 2.5 cm exophytic lesion arising from the interpolar region of the left kidney. Recommend further evaluation with dedicated renal cyst protocol CT to exclude possibility of malignancy. No evidence of hydronephrosis or nephrolithiasis. There is also a nonspecific soft tissue density mass of the left UVJ (series 2, image 54). Stomach/Bowel: No evidence of bowel obstruction. Diverticulosis without evidence of diverticulitis. The appendix is not well visualized. Vascular/Lymphatic: Mild aortic  atherosclerotic calcifications. No evidence of retroperitoneal, pelvic, or inguinal lymphadenopathy. Reproductive: Prostate is enlarged. Other: No abdominal wall hernia or abnormality. No abdominopelvic ascites. Musculoskeletal: No acute or significant osseous findings. IMPRESSION: 1. No evidence of hydronephrosis or nephrolithiasis. 2. Nonspecific soft tissue density mass at the left UVJ. Recommend further evaluation with a hematuria protocol CT with and without contrast. 3. Indeterminate 2.8 cm exophytic lesion arising from the interpolar region of the left kidney. Recommend attention on hematuria protocol CT. 4. Diverticulosis without evidence of diverticulitis. Aortic Atherosclerosis (ICD10-I70.0). Electronically Signed   By: Lorenza Cambridge M.D.   On: 12/04/2022 12:40    Procedures  Procedures    Medications Ordered in ED Medications - No data to display  ED Course/ Medical Decision Making/ A&P Clinical Course as of 12/04/22 1315  Sat Dec 04, 2022  1313 I spoke to Dr Marcha Solders from urology who can arrange for close and rapid outpatient follow-up for likely cystoscopy to further evaluate these lesions, which may be malignant.  I discussed this with the patient and his family at the bedside who are in agreement.  Patient reports that he has urinated again several times coming into the ED and his urine has completely cleared.  I do think he is stable for discharge, but emphasized the need for follow-up. [MT]    Clinical Course User Index [MT] , Kermit Balo, MD                                 Medical Decision Making Amount and/or Complexity of Data Reviewed Labs: ordered. Radiology: ordered.   Patient is presenting to the ED with hematuria.  Differential include ureteral colic or stone versus UTI versus bladder lesion or cancer versus other  Vital signs are largely unremarkable, doubt significant hemorrhage blood loss, no indication for blood test at this time.  UA was personally  reviewed and interpreted, with large hemoglobin, no clear evidence of infection.  Low suspicion for prostatitis as he has no pelvic discomfort or pain, or fever.  CT renal stone study was obtained and personally reviewed & interpreted, showing left-sided renal mass and left distal ureteral mass.  I suspect these masses are likely the cause of the patient's bleeding.  The patient is requesting a urologist locally as he wishes to transfer practices.  I was able to speak with our on-call urologist, see ED course, and we have arranged for very close outpatient follow-up.  The patient's urine cleared in the ED, and he is otherwise asymptomatic and stable for discharge at this time.  I do not see indication for emergent hospitalization.          Final Clinical Impression(s) / ED Diagnoses Final diagnoses:  Hematuria, unspecified type  Left renal mass  Ureteral mass    Rx / DC Orders ED Discharge Orders     None         Terald Sleeper, MD 12/04/22 1315

## 2022-12-04 NOTE — ED Triage Notes (Signed)
Patient arrives with complaints of blood in his urine x1 day and dull right flank pain. Rates pain 2/10.

## 2022-12-04 NOTE — Discharge Instructions (Signed)
Your CT scan in the ER today showed that you have a mass on your left kidney as well as your left ureter, which may be concerning for cancer or malignancy.  This needs further workup by a specialist, urologist.  Please call the number above on Monday to schedule follow-up appointment with a urologist.  In the meantime continue drinking plenty of water at home.  In most cases bleeding in the urine will often resolve after 2 or 3 days, but may recur in the future.  If you begin to feel dizzy, lightheaded, short of breath, or have any other emergency medical concerns, please return to the emergency department.

## 2022-12-07 ENCOUNTER — Ambulatory Visit: Payer: PPO | Admitting: Urology

## 2022-12-07 ENCOUNTER — Encounter: Payer: Self-pay | Admitting: Urology

## 2022-12-07 VITALS — BP 152/71 | HR 71 | Ht 69.0 in | Wt 165.0 lb

## 2022-12-07 DIAGNOSIS — N138 Other obstructive and reflux uropathy: Secondary | ICD-10-CM | POA: Insufficient documentation

## 2022-12-07 DIAGNOSIS — N2889 Other specified disorders of kidney and ureter: Secondary | ICD-10-CM | POA: Diagnosis not present

## 2022-12-07 DIAGNOSIS — E291 Testicular hypofunction: Secondary | ICD-10-CM | POA: Diagnosis not present

## 2022-12-07 DIAGNOSIS — R310 Gross hematuria: Secondary | ICD-10-CM | POA: Diagnosis not present

## 2022-12-07 DIAGNOSIS — N401 Enlarged prostate with lower urinary tract symptoms: Secondary | ICD-10-CM | POA: Diagnosis not present

## 2022-12-07 DIAGNOSIS — R972 Elevated prostate specific antigen [PSA]: Secondary | ICD-10-CM

## 2022-12-07 NOTE — Progress Notes (Signed)
Assessment: 1. Gross hematuria   2. Elevated PSA   3. BPH with obstruction/lower urinary tract symptoms   4. Hypogonadism in male   5. Renal mass, left     Plan: I personally reviewed the patient's chart including provider notes, lab and imaging results. I personally reviewed the CT study from 12/04/2022 with results as noted below. Today I had a discussion with the patient regarding the findings of gross hematuria including the implications and differential diagnoses associated with it.  I also discussed recommendations for further evaluation including the rationale for upper tract imaging and cystoscopy.  I discussed the nature of these procedures including potential risk and complications.  The patient expressed an understanding of these issues. Schedule for CT hematuria protocol and cystoscopy Request records from Dr. Saddie Benders in [place].   Chief Complaint:  Chief Complaint  Patient presents with   Hematuria    History of Present Illness:  Ryan Beard is a 72 y.o. male who is seen for evaluation of gross hematuria. He presented to the emergency room on 12/04/2022 with gross hematuria.  He also had some low back pain. His symptoms began after playing golf on 12/02/2022.  He initially noted some red urine which has changed to a brown color and has cleared today.  He did pass a few small clots.  No flank pain.  No dysuria.  He has noted some red-tinged urine intermittently after activity such as golf for approximately 1 year.  No prior evaluation with cystoscopy. CT imaging from 12/04/2022 showed an indeterminant 2.8 x 2.5 cm exophytic lesion arising from the interpolar region of the left kidney, no hydronephrosis, no renal calculi, nonspecific soft tissue density noted at the left UVJ with associated calcification.  He has a history of an elevated PSA.  He has previously been followed by Dr. Saddie Benders in [place].  He underwent a prostate biopsy in January 2018 which was benign.  Confirm MDX  was negative.  He has been followed with periodic prostate MRIs.  Prostate MRI from 11/18 and 3/21 showed no evidence of high-grade prostate cancer.  Prostate volume from 3/21 was 74 cm. He reports that his most recent PSA was around 17. He scheduled for a 3T prostate MRI tomorrow at [facility] imaging.  He has a history of lower urinary tract symptoms including intermittent stream, urgency, decreased force of stream, and nocturia.  He is currently on tamsulosin. IPSS = 23 today.  He has a history of hypogonadism.  He is currently on Clomid.  He is followed at [facility].   Past Medical History:  Past Medical History:  Diagnosis Date   Abnormal PSA    Hyperlipidemia     Past Surgical History:  Past Surgical History:  Procedure Laterality Date   APPENDECTOMY  1973   CARDIAC CATHETERIZATION     PROSTATE BIOPSY     SEPTOPLASTY  1960   TONSILLECTOMY  1960    Allergies:  No Known Allergies  Family History:  Family History  Problem Relation Age of Onset   Heart disease Mother    Cancer Father        lymphoma   Hypertension Father    Alcohol abuse Sister    Colon cancer Neg Hx    Esophageal cancer Neg Hx    Rectal cancer Neg Hx    Stomach cancer Neg Hx    Prostate cancer Neg Hx     Social History:  Social History   Tobacco Use   Smoking status:  Former    Current packs/day: 0.00    Average packs/day: 1 pack/day for 30.0 years (30.0 ttl pk-yrs)    Types: Cigarettes    Start date: 05/04/1975    Quit date: 05/03/2005    Years since quitting: 17.6    Passive exposure: Never   Smokeless tobacco: Never  Vaping Use   Vaping status: Never Used  Substance Use Topics   Alcohol use: Yes    Alcohol/week: 1.0 standard drink of alcohol    Types: 1 Glasses of wine per week   Drug use: No    Review of symptoms:  Constitutional:  Negative for unexplained weight loss, night sweats, fever, chills ENT:  Negative for nose bleeds, sinus pain, painful swallowing CV:  Negative for  chest pain, shortness of breath, exercise intolerance, palpitations, loss of consciousness Resp:  Negative for cough, wheezing, shortness of breath GI:  Negative for nausea, vomiting, diarrhea, bloody stools GU:  Positives noted in HPI; otherwise negative for dysuria, urinary incontinence Neuro:  Negative for seizures, poor balance, limb weakness, slurred speech Psych:  Negative for lack of energy, depression, anxiety Endocrine:  Negative for polydipsia, polyuria, symptoms of hypoglycemia (dizziness, hunger, sweating) Hematologic:  Negative for anemia, purpura, petechia, prolonged or excessive bleeding, use of anticoagulants  Allergic:  Negative for difficulty breathing or choking as a result of exposure to anything; no shellfish allergy; no allergic response (rash/itch) to materials, foods  Physical exam: BP (!) 152/71   Pulse 71   Ht 5\' 9"  (1.753 m)   Wt 165 lb (74.8 kg)   BMI 24.37 kg/m  GENERAL APPEARANCE:  Well appearing, well developed, well nourished, NAD HEENT: Atraumatic, Normocephalic, oropharynx clear. NECK: Supple without lymphadenopathy or thyromegaly. LUNGS: Clear to auscultation bilaterally. HEART: Regular Rate and Rhythm without murmurs, gallops, or rubs. ABDOMEN: Soft, non-tender, No Masses. EXTREMITIES: Moves all extremities well.  Without clubbing, cyanosis, or edema. NEUROLOGIC:  Alert and oriented x 3, normal gait, CN II-XII grossly intact.  MENTAL STATUS:  Appropriate. BACK:  Non-tender to palpation.  No CVAT SKIN:  Warm, dry and intact.   GU: Penis:  circumcised Meatus: Normal Scrotum: normal, no masses Testis: normal without masses bilateral Epididymis: normal Prostate: 60 g, NT, no nodules Rectum: Normal tone,  no masses or tenderness   Results: U/A:  3-10 RBCs

## 2022-12-08 ENCOUNTER — Ambulatory Visit
Admission: RE | Admit: 2022-12-08 | Discharge: 2022-12-08 | Disposition: A | Discharge status: Home / Self Care | Payer: PPO | Source: Ambulatory Visit | Attending: Family Medicine | Admitting: Family Medicine

## 2022-12-08 DIAGNOSIS — R972 Elevated prostate specific antigen [PSA]: Secondary | ICD-10-CM

## 2022-12-08 DIAGNOSIS — E291 Testicular hypofunction: Secondary | ICD-10-CM

## 2022-12-08 MED ORDER — GADOPICLENOL 0.5 MMOL/ML IV SOLN
8.0000 mL | Freq: Once | INTRAVENOUS | Status: AC | PRN
  Administered 2022-12-08: 8 mL via INTRAVENOUS

## 2022-12-09 ENCOUNTER — Telehealth: Payer: Self-pay

## 2022-12-09 NOTE — Telephone Encounter (Signed)
Transition Care Management Follow-up Telephone Call Date of discharge and from where: 12/04/2022 [facility] How have you been since you were released from the hospital? Patient is feeling better.  Any questions or concerns? No  Items Reviewed: Did the pt receive and understand the discharge instructions provided? Yes  Medications obtained and verified?  No medication prescribed. Other? No  Any new allergies since your discharge? No  Dietary orders reviewed? Yes Do you have support at home? Yes   Follow up appointments reviewed:  PCP Hospital f/u appt confirmed? Yes  Scheduled to see Saralyn Pilar, PA on 01/06/2023 @ [facility]. Specialist Hospital f/u appt confirmed? Yes  Scheduled to see Di Kindle, MD on 12/07/2022 @ [facility], follow-up visit 12/29/2022. Are transportation arrangements needed? No  If their condition worsens, is the pt aware to call PCP or go to the Emergency Dept.? Yes Was the patient provided with contact information for the PCP's office or ED? Yes Was to pt encouraged to call back with questions or concerns? Yes   [facility]  [facility] Population Health Community Resource Care Guide   [email or web address]  ?? [phone]   Website: [email or web address]  [email or web address]

## 2022-12-13 DIAGNOSIS — L814 Other melanin hyperpigmentation: Secondary | ICD-10-CM | POA: Diagnosis not present

## 2022-12-13 DIAGNOSIS — D225 Melanocytic nevi of trunk: Secondary | ICD-10-CM | POA: Diagnosis not present

## 2022-12-13 DIAGNOSIS — Z08 Encounter for follow-up examination after completed treatment for malignant neoplasm: Secondary | ICD-10-CM | POA: Diagnosis not present

## 2022-12-13 DIAGNOSIS — D485 Neoplasm of uncertain behavior of skin: Secondary | ICD-10-CM | POA: Diagnosis not present

## 2022-12-13 DIAGNOSIS — L821 Other seborrheic keratosis: Secondary | ICD-10-CM | POA: Diagnosis not present

## 2022-12-13 DIAGNOSIS — Z85828 Personal history of other malignant neoplasm of skin: Secondary | ICD-10-CM | POA: Diagnosis not present

## 2022-12-13 DIAGNOSIS — L570 Actinic keratosis: Secondary | ICD-10-CM | POA: Diagnosis not present

## 2022-12-15 ENCOUNTER — Encounter (HOSPITAL_BASED_OUTPATIENT_CLINIC_OR_DEPARTMENT_OTHER): Payer: Self-pay

## 2022-12-15 ENCOUNTER — Ambulatory Visit (HOSPITAL_BASED_OUTPATIENT_CLINIC_OR_DEPARTMENT_OTHER)
Admission: RE | Admit: 2022-12-15 | Discharge: 2022-12-15 | Disposition: A | Discharge status: Home / Self Care | Payer: PPO | Source: Ambulatory Visit | Attending: Urology | Admitting: Urology

## 2022-12-15 DIAGNOSIS — K449 Diaphragmatic hernia without obstruction or gangrene: Secondary | ICD-10-CM | POA: Diagnosis not present

## 2022-12-15 DIAGNOSIS — N200 Calculus of kidney: Secondary | ICD-10-CM | POA: Diagnosis not present

## 2022-12-15 DIAGNOSIS — R310 Gross hematuria: Secondary | ICD-10-CM | POA: Insufficient documentation

## 2022-12-15 DIAGNOSIS — N281 Cyst of kidney, acquired: Secondary | ICD-10-CM | POA: Diagnosis not present

## 2022-12-15 MED ORDER — IOHEXOL 300 MG/ML  SOLN
100.0000 mL | Freq: Once | INTRAMUSCULAR | Status: AC | PRN
  Administered 2022-12-15: 125 mL via INTRAVENOUS

## 2022-12-28 ENCOUNTER — Other Ambulatory Visit: Payer: Self-pay

## 2022-12-28 DIAGNOSIS — I10 Essential (primary) hypertension: Secondary | ICD-10-CM

## 2022-12-29 ENCOUNTER — Ambulatory Visit: Payer: PPO | Admitting: Urology

## 2022-12-29 VITALS — BP 142/78 | HR 76 | Ht 69.0 in | Wt 165.0 lb

## 2022-12-29 DIAGNOSIS — N401 Enlarged prostate with lower urinary tract symptoms: Secondary | ICD-10-CM | POA: Diagnosis not present

## 2022-12-29 DIAGNOSIS — R310 Gross hematuria: Secondary | ICD-10-CM

## 2022-12-29 DIAGNOSIS — R972 Elevated prostate specific antigen [PSA]: Secondary | ICD-10-CM | POA: Diagnosis not present

## 2022-12-29 DIAGNOSIS — N281 Cyst of kidney, acquired: Secondary | ICD-10-CM

## 2022-12-29 DIAGNOSIS — E291 Testicular hypofunction: Secondary | ICD-10-CM

## 2022-12-29 DIAGNOSIS — N138 Other obstructive and reflux uropathy: Secondary | ICD-10-CM | POA: Diagnosis not present

## 2022-12-29 MED ORDER — FINASTERIDE 5 MG PO TABS
5.0000 mg | ORAL_TABLET | Freq: Every day | ORAL | 11 refills | Status: DC
Start: 2022-12-29 — End: 2023-02-09

## 2022-12-29 MED ORDER — TADALAFIL 5 MG PO TABS
5.0000 mg | ORAL_TABLET | Freq: Every day | ORAL | 5 refills | Status: DC | PRN
Start: 2022-12-29 — End: 2023-09-01

## 2022-12-29 MED ORDER — CIPROFLOXACIN HCL 500 MG PO TABS
500.0000 mg | ORAL_TABLET | Freq: Once | ORAL | Status: AC
Start: 2022-12-29 — End: 2022-12-29
  Administered 2022-12-29: 500 mg via ORAL

## 2022-12-29 MED ORDER — TAMSULOSIN HCL 0.4 MG PO CAPS
0.4000 mg | ORAL_CAPSULE | Freq: Every day | ORAL | 11 refills | Status: DC | PRN
Start: 2022-12-29 — End: 2023-02-18

## 2022-12-29 NOTE — Progress Notes (Signed)
Assessment: 1. Gross hematuria   2. Elevated PSA   3. BPH with obstruction/lower urinary tract symptoms   4. Hypogonadism in male   5. Renal cyst, left     Plan: I personally reviewed the CT study from 12/22/2022 with results as noted below. I reviewed the MRI results from 12/08/2022 with results as noted below. Findings on cystoscopy discussed with the patient. Cipro x 1 following cystoscopy. Continue tamsulosin and tadalafil. I discussed options for management of his BPH with lower urinary tract symptoms.  Given the extremely large size of his prostate, I recommended that he consider either HoLEP or simple prostatectomy for management.  Both procedures were briefly discussed.  He would like to consider the HoLEP procedure. Referral to Dr. Legrand Rams in [place] for possible HoLEP. I discussed a trial of finasteride 5 mg daily for management of his BPH as well as hematuria which is likely prostatic in nature. Return to office in 2 months  Chief Complaint:  Chief Complaint  Patient presents with   Cysto    History of Present Illness:  Ryan Beard is a 72 y.o. male who is seen for further evaluation of gross hematuria, BPH with obstruction, and elevated PSA. He presented to the emergency room on 12/04/2022 with gross hematuria.  He also had some low back pain. His symptoms began after playing golf on 12/02/2022.  He initially noted some red urine which has changed to a brown color and has cleared today.  He did pass a few small clots.  No flank pain.  No dysuria.  He has noted some red-tinged urine intermittently after activity such as golf for approximately 1 year.  No prior evaluation with cystoscopy. CT imaging from 12/04/2022 showed an indeterminant 2.8 x 2.5 cm exophytic lesion arising from the interpolar region of the left kidney, no hydronephrosis, no renal calculi, nonspecific soft tissue density noted at the left UVJ with associated calcification.  He has a history of an  elevated PSA.  He has previously been followed by Dr. Saddie Benders in [place].  He underwent a prostate biopsy in January 2018 which was benign.  Confirm MDX was negative.  He has been followed with periodic prostate MRIs.  Prostate MRI from 11/18 and 3/21 showed no evidence of high-grade prostate cancer.  Prostate volume from 3/21 was 74 cm. PSA from 04/14/2022: 17.4 PSA from 10/12/2022: 23.1 Prostate MRI from 12/07/2021 showed no suspicious lesions within the prostate, prostate volume of 170 cm, possible heaped mucosa/tumor in bladder.  He has a history of lower urinary tract symptoms including intermittent stream, urgency, decreased force of stream, and nocturia.  He was on tamsulosin. IPSS = 23.  He has a history of hypogonadism.  He is currently on Clomid.  He is followed at [facility].  He presents today for further evaluation of hematuria with cystoscopy. CT hematuria protocol from 12/22/2022 showed a very large prostate with mass effect on the bladder, bladder wall thickening, no obvious filling defects in either collecting system, small left renal stones, 2 cm cyst on the left kidney, and small bladder stone. He reports 1 episode of gross hematuria while golfing recently.  His urinary symptoms are unchanged.  He continues with a weak stream, straining, and intermittent stream. IPSS = 23.  He continues on tamsulosin and tadalafil.  Portions of the above documentation were copied from a prior visit for review purposes only.  Past Medical History:  Past Medical History:  Diagnosis Date   Abnormal PSA  Hyperlipidemia     Past Surgical History:  Past Surgical History:  Procedure Laterality Date   APPENDECTOMY  1973   CARDIAC CATHETERIZATION     PROSTATE BIOPSY     SEPTOPLASTY  1960   TONSILLECTOMY  1960    Allergies:  No Known Allergies  Family History:  Family History  Problem Relation Age of Onset   Heart disease Mother    Cancer Father        lymphoma   Hypertension Father     Alcohol abuse Sister    Colon cancer Neg Hx    Esophageal cancer Neg Hx    Rectal cancer Neg Hx    Stomach cancer Neg Hx    Prostate cancer Neg Hx     Social History:  Social History   Tobacco Use   Smoking status: Former    Current packs/day: 0.00    Average packs/day: 1 pack/day for 30.0 years (30.0 ttl pk-yrs)    Types: Cigarettes    Start date: 05/04/1975    Quit date: 05/03/2005    Years since quitting: 17.6    Passive exposure: Never   Smokeless tobacco: Never  Vaping Use   Vaping status: Never Used  Substance Use Topics   Alcohol use: Yes    Alcohol/week: 1.0 standard drink of alcohol    Types: 1 Glasses of wine per week   Drug use: No    ROS: Constitutional:  Negative for fever, chills, weight loss CV: Negative for chest pain, previous MI, hypertension Respiratory:  Negative for shortness of breath, wheezing, sleep apnea, frequent cough GI:  Negative for nausea, vomiting, bloody stool, GERD  Physical exam: BP (!) 142/78   Pulse 76   Ht 5\' 9"  (1.753 m)   Wt 165 lb (74.8 kg)   BMI 24.37 kg/m  GENERAL APPEARANCE:  Well appearing, well developed, well nourished, NAD HEENT:  Atraumatic, normocephalic, oropharynx clear NECK:  Supple without lymphadenopathy or thyromegaly ABDOMEN:  Soft, non-tender, no masses EXTREMITIES:  Moves all extremities well, without clubbing, cyanosis, or edema NEUROLOGIC:  Alert and oriented x 3, normal gait, CN II-XII grossly intact MENTAL STATUS:  appropriate BACK:  Non-tender to palpation, No CVAT SKIN:  Warm, dry, and intact   Results: U/A:  negative  Procedure:  Flexible Cystourethroscopy  Pre-operative Diagnosis: Gross hematuria  Post-operative Diagnosis: Gross hematuria; BPH with obstruction  Anesthesia:  local with lidocaine jelly  Surgical Narrative:  After appropriate informed consent was obtained, the patient was prepped and draped in the usual sterile fashion in the supine position.  The patient was correctly  identified and the proper procedure delineated prior to proceeding.  Sterile lidocaine gel was instilled in the urethra. The flexible cystoscope was introduced without difficulty.  Findings:  Anterior urethra: Normal  Posterior urethra:  very large lateral lobes of prostate with median lobe; increased vascularity in prostatic urethra  Bladder:  no obvious papillary lesions seen; visualization limited due to large median lobe and bleeding with manipulation of cystoscope  Ureteral orifices:  not well visualized   Additional findings: none  Saline bladder wash for cytology was not performed.    The cystoscope was then removed.  The patient tolerated the procedure well.

## 2022-12-30 ENCOUNTER — Other Ambulatory Visit: Payer: PPO

## 2022-12-30 ENCOUNTER — Other Ambulatory Visit: Payer: Self-pay | Admitting: Family Medicine

## 2022-12-30 DIAGNOSIS — I10 Essential (primary) hypertension: Secondary | ICD-10-CM | POA: Diagnosis not present

## 2022-12-30 DIAGNOSIS — Z8639 Personal history of other endocrine, nutritional and metabolic disease: Secondary | ICD-10-CM

## 2022-12-30 NOTE — Addendum Note (Signed)
Addended by: Tonny Bollman on: 12/30/2022 09:35 AM   Modules accepted: Orders

## 2022-12-31 LAB — VITAMIN D 25 HYDROXY (VIT D DEFICIENCY, FRACTURES): Vit D, 25-Hydroxy: 47.3 ng/mL (ref 30.0–100.0)

## 2022-12-31 LAB — COMPREHENSIVE METABOLIC PANEL
ALT: 18 IU/L (ref 0–44)
AST: 16 IU/L (ref 0–40)
Albumin: 4.5 g/dL (ref 3.9–4.9)
Alkaline Phosphatase: 40 IU/L — ABNORMAL LOW (ref 44–121)
BUN/Creatinine Ratio: 13 (ref 10–24)
BUN: 12 mg/dL (ref 8–27)
Bilirubin Total: 0.4 mg/dL (ref 0.0–1.2)
CO2: 22 mmol/L (ref 20–29)
Calcium: 9.3 mg/dL (ref 8.6–10.2)
Chloride: 106 mmol/L (ref 96–106)
Creatinine, Ser: 0.96 mg/dL (ref 0.76–1.27)
Globulin, Total: 1.8 g/dL (ref 1.5–4.5)
Glucose: 94 mg/dL (ref 70–99)
Potassium: 4.4 mmol/L (ref 3.5–5.2)
Sodium: 141 mmol/L (ref 134–144)
Total Protein: 6.3 g/dL (ref 6.0–8.5)
eGFR: 85 mL/min/{1.73_m2} (ref >59)

## 2023-01-05 ENCOUNTER — Encounter: Payer: Self-pay | Admitting: Family Medicine

## 2023-01-05 ENCOUNTER — Ambulatory Visit: Payer: PPO | Admitting: Family Medicine

## 2023-01-05 VITALS — BP 130/78 | HR 60 | Ht 69.0 in | Wt 166.8 lb

## 2023-01-05 DIAGNOSIS — Z833 Family history of diabetes mellitus: Secondary | ICD-10-CM

## 2023-01-05 DIAGNOSIS — I10 Essential (primary) hypertension: Secondary | ICD-10-CM

## 2023-01-05 DIAGNOSIS — E7800 Pure hypercholesterolemia, unspecified: Secondary | ICD-10-CM

## 2023-01-05 DIAGNOSIS — Z131 Encounter for screening for diabetes mellitus: Secondary | ICD-10-CM | POA: Diagnosis not present

## 2023-01-05 MED ORDER — LISINOPRIL 5 MG PO TABS
5.0000 mg | ORAL_TABLET | Freq: Every day | ORAL | 1 refills | Status: DC
Start: 2023-01-05 — End: 2023-01-12

## 2023-01-05 NOTE — Patient Instructions (Signed)
Let me know if your blood pressure is consistently above 140 on the top or 90 on the bottom at home.

## 2023-01-05 NOTE — Assessment & Plan Note (Signed)
Last lipid panel: LDL 98, HDL 49, triglycerides 128. Continue rosuvastatin 20 mg daily.  Encouraged to continue limiting unhealthy fats and sugary foods, maintain current exercise routine.

## 2023-01-05 NOTE — Assessment & Plan Note (Addendum)
BP goal <140/90. Stable, close to goal in office and at goal at home. Continue lisinopril 5 mg daily. Encouraged to continue regular exercise and maintain low sodium intake.  Patient expressed understanding and is agreeable to this plan.

## 2023-01-05 NOTE — Progress Notes (Signed)
   Established Patient Office Visit  Subjective   Patient ID: Ryan Beard, male    DOB: 05-17-1950  Age: 72 y.o. MRN: [id number]  Chief Complaint  Patient presents with   Medical Management of Chronic Issues    HPI [name] is a 72 y.o. male presenting today for follow up of hypertension, hyperlipidemia. Hypertension: Patient here for follow-up of elevated blood pressure. He is exercising and is adherent to low salt diet.   Pt denies chest pain, SOB, dizziness, edema, syncope, fatigue or heart palpitations. Taking lisinopril, reports excellent compliance with treatment. Denies side effects.  Given his recent hematuria and changes in BPH, he has been under an increased amount of stress. Hyperlipidemia: tolerating rosuvastatin well with no myalgias or significant side effects. Currently consuming a general diet high in fruits and vegetables, many of which he grows himself. Home exercise routine includes walking at least 2 miles a day.  The 10-year ASCVD risk score (Arnett DK, et al., 2019) is: 20%  ROS Negative unless otherwise noted in HPI   Objective:     BP 130/78 Comment: home BP  Pulse 60   Ht 5\' 9"  (1.753 m)   Wt 166 lb 12.8 oz (75.7 kg)   SpO2 99%   BMI 24.63 kg/m   Physical Exam Constitutional:      General: He is not in acute distress.    Appearance: Normal appearance.  HENT:     Head: Normocephalic and atraumatic.  Cardiovascular:     Rate and Rhythm: Normal rate and regular rhythm.     Heart sounds: Normal heart sounds. No murmur heard.    No friction rub. No gallop.  Pulmonary:     Effort: Pulmonary effort is normal. No respiratory distress.     Breath sounds: Normal breath sounds. No wheezing, rhonchi or rales.  Skin:    General: Skin is warm and dry.  Neurological:     Mental Status: He is alert and oriented to person, place, and time.  Psychiatric:        Mood and Affect: Mood normal.     Assessment & Plan:  Essential hypertension Assessment  & Plan: BP goal <140/90. Stable, close to goal in office and at goal at home. Continue lisinopril 5 mg daily. Encouraged to continue regular exercise and maintain low sodium intake.  Patient expressed understanding and is agreeable to this plan.   Orders: -     Lisinopril; Take 1 tablet (5 mg total) by mouth daily.  Dispense: 90 tablet; Refill: 1  Pure hypercholesterolemia Assessment & Plan: Last lipid panel: LDL 98, HDL 49, triglycerides 128. Continue rosuvastatin 20 mg daily.  Encouraged to continue limiting unhealthy fats and sugary foods, maintain current exercise routine.   He is going to wait on the Cologuard until his prostate issues have been sorted out.  Return in about 6 months (around 07/05/2023) for annual physical, fasting blood work 1 week before.    Melida Quitter, PA

## 2023-01-06 ENCOUNTER — Ambulatory Visit (INDEPENDENT_AMBULATORY_CARE_PROVIDER_SITE_OTHER): Payer: PPO

## 2023-01-06 VITALS — Ht 69.0 in | Wt 165.0 lb

## 2023-01-06 DIAGNOSIS — Z0000 Encounter for general adult medical examination without abnormal findings: Secondary | ICD-10-CM | POA: Diagnosis not present

## 2023-01-06 NOTE — Progress Notes (Signed)
Subjective:   Ryan Beard is a 72 y.o. male who presents for Medicare Annual/Subsequent preventive examination.  Visit Complete: Virtual  I connected with  Ryan Beard on 01/06/23 by a audio enabled telemedicine application and verified that I am speaking with the correct person using two identifiers.  Patient Location: Home  Provider Location: Home Office  I discussed the limitations of evaluation and management by telemedicine. The patient expressed understanding and agreed to proceed.  Patient Medicare AWV questionnaire was completed by the patient on 01/02/23; I have confirmed that all information answered by patient is correct and no changes since this date.  Review of Systems    Vital Signs: Unable to obtain new vitals due to this being a telehealth visit.  Cardiac Risk Factors include: advanced age (>61men, >44 women);male gender;hypertension     Objective:    Today's Vitals   01/06/23 1047  Weight: 165 lb (74.8 kg)  Height: 5\' 9"  (1.753 m)   Body mass index is 24.37 kg/m.     01/06/2023   11:00 AM 12/04/2022   11:18 AM 08/15/2017    4:31 PM  Advanced Directives  Does Patient Have a Medical Advance Directive? Yes Yes Yes  Type of Estate agent of [place];Living will  Healthcare Power of [place];Living will  Copy of Healthcare Power of Attorney in Chart? No - copy requested  No - copy requested    Current Medications (verified) Outpatient Encounter Medications as of 01/06/2023  Medication Sig   Acetylcysteine (N-ACETYL-L-CYSTEINE PO) Take 600 mg by mouth daily.   BLACK PEPPER-TURMERIC PO Take 600 mg by mouth daily.   CHELATED MAGNESIUM PO Take 1 tablet by mouth daily.   Cholecalciferol (VITAMIN D-3) 5000 UNITS TABS Take by mouth daily.   Cyanocobalamin (B-12) 2500 MCG TABS Take by mouth daily.   finasteride (PROSCAR) 5 MG tablet Take 1 tablet (5 mg total) by mouth daily.   Krill Oil 500 MG CAPS Take 1 capsule by mouth daily.    L-Arginine 500 MG TABS Take 1 tablet by mouth daily.   lisinopril (ZESTRIL) 5 MG tablet Take 1 tablet (5 mg total) by mouth daily.   Pregnenolone POWD 75 mg by Does not apply route daily.   rosuvastatin (CRESTOR) 20 MG tablet Take 1 tablet (20 mg total) by mouth daily.   saw palmetto 160 MG capsule Take 160 mg by mouth 2 (two) times daily.   tadalafil (CIALIS) 5 MG tablet Take 1 tablet (5 mg total) by mouth daily as needed for erectile dysfunction.   tamsulosin (FLOMAX) 0.4 MG CAPS capsule Take 1 capsule (0.4 mg total) by mouth daily as needed.   UNABLE TO FIND daily. Med Name: ZMA b6 magnesium and zinc   UNABLE TO FIND Take 150 mg by mouth daily. Med Name: K2 MK7   UNABLE TO FIND Med Name: Olipure BP   UNABLE TO FIND Take 12.5 mg by mouth daily. Med Name: I-Throid Iodine   vitamin C (ASCORBIC ACID) 500 MG tablet Take 500 mg by mouth daily.   vitamin E 180 MG (400 UNITS) capsule Take 400 Units by mouth daily.   No facility-administered encounter medications on file as of 01/06/2023.    Allergies (verified) Patient has no known allergies.   History: Past Medical History:  Diagnosis Date   Abnormal PSA    Hyperlipidemia    Past Surgical History:  Procedure Laterality Date   APPENDECTOMY  1973   CARDIAC CATHETERIZATION     PROSTATE  BIOPSY     SEPTOPLASTY  1960   TONSILLECTOMY  1960   Family History  Problem Relation Age of Onset   Heart disease Mother    Cancer Father        lymphoma   Hypertension Father    Alcohol abuse Sister    Colon cancer Neg Hx    Esophageal cancer Neg Hx    Rectal cancer Neg Hx    Stomach cancer Neg Hx    Prostate cancer Neg Hx    Social History   Socioeconomic History   Marital status: Single    Spouse name: Not on file   Number of children: Not on file   Years of education: Not on file   Highest education level: Not on file  Occupational History   Not on file  Tobacco Use   Smoking status: Former    Current packs/day: 0.00    Average  packs/day: 1 pack/day for 30.0 years (30.0 ttl pk-yrs)    Types: Cigarettes    Start date: 05/04/1975    Quit date: 05/03/2005    Years since quitting: 17.6    Passive exposure: Never   Smokeless tobacco: Never  Vaping Use   Vaping status: Never Used  Substance and Sexual Activity   Alcohol use: Yes    Alcohol/week: 1.0 standard drink of alcohol    Types: 1 Glasses of wine per week   Drug use: No   Sexual activity: Yes    Birth control/protection: None, Post-menopausal  Other Topics Concern   Not on file  Social History Narrative   Not on file   Social Determinants of Health   Financial Resource Strain: Low Risk  (01/06/2023)   Overall Financial Resource Strain (CARDIA)    Difficulty of Paying Living Expenses: Not hard at all  Food Insecurity: No Food Insecurity (01/06/2023)   Hunger Vital Sign    Worried About Running Out of Food in the Last Year: Never true    Ran Out of Food in the Last Year: Never true  Transportation Needs: No Transportation Needs (01/06/2023)   PRAPARE - Administrator, Civil Service (Medical): No    Lack of Transportation (Non-Medical): No  Physical Activity: Sufficiently Active (01/06/2023)   Exercise Vital Sign    Days of Exercise per Week: 7 days    Minutes of Exercise per Session: 60 min  Stress: No Stress Concern Present (01/06/2023)   [employer] of Occupational Health - Occupational Stress Questionnaire    Feeling of Stress : Not at all  Social Connections: Moderately Integrated (01/06/2023)   Social Connection and Isolation Panel [NHANES]    Frequency of Communication with Friends and Family: More than three times a week    Frequency of Social Gatherings with Friends and Family: More than three times a week    Attends Religious Services: More than 4 times per year    Active Member of [employer] or Organizations: Yes    Attends Engineer, structural: More than 4 times per year    Marital Status: Divorced    Tobacco  Counseling Counseling given: Not Answered   Clinical Intake:  Pre-visit preparation completed: Yes  Pain : No/denies pain     BMI - recorded: 24.37 Nutritional Status: BMI of 19-24  Normal Nutritional Risks: None Diabetes: No  How often do you need to have someone help you when you read instructions, pamphlets, or other written materials from your doctor or pharmacy?: 1 - Never  Interpreter  Needed?: No  Information entered by :: Theresa Mulligan LPN   Activities of Daily Living    01/02/2023    9:03 AM  In your present state of health, do you have any difficulty performing the following activities:  Hearing? 0  Vision? 1  Comment Cataract Forming  Difficulty concentrating or making decisions? 0  Walking or climbing stairs? 0  Dressing or bathing? 0  Doing errands, shopping? 0  Preparing Food and eating ? N  Using the Toilet? N  In the past six months, have you accidently leaked urine? N  Do you have problems with loss of bowel control? N  Managing your Medications? N  Managing your Finances? N  Housekeeping or managing your Housekeeping? N    Patient Care Team: Melida Quitter, PA as PCP - General (Family Medicine) Jodelle Red, MD as PCP - Cardiology (Cardiology)  Indicate any recent Medical Services you may have received from other than [facility] providers in the past year (date may be approximate).     Assessment:   This is a routine wellness examination for [employer].  Hearing/Vision screen Hearing Screening - Comments:: Denies hearing difficulties   Vision Screening - Comments:: Wears rx glasses - up to date with routine eye exams with  [facility]   Goals Addressed               This Visit's Progress     Stay Healthy (pt-stated)         Depression Screen    01/06/2023   10:53 AM 01/05/2023   10:15 AM 12/09/2021    8:56 AM 06/10/2021    8:52 AM 12/08/2020    9:09 AM 06/09/2020    8:59 AM 12/17/2019    9:48 AM  PHQ 2/9 Scores  PHQ - 2 Score 0 0  0 0 0 0 0  PHQ- 9 Score 0 0 0 0 0 0 0    Fall Risk    01/02/2023    9:03 AM 06/10/2021    8:51 AM 12/08/2020    9:09 AM 06/09/2020    8:58 AM 12/17/2019    9:47 AM  Fall Risk   Falls in the past year? 0 0 0 0 0  Number falls in past yr:  0 0    Injury with Fall? 0 0 0    Risk for fall due to :  No Fall Risks No Fall Risks  No Fall Risks  Follow up  Falls evaluation completed Falls evaluation completed Falls evaluation completed Falls evaluation completed    MEDICARE RISK AT HOME: Medicare Risk at Home Any stairs in or around the home?: Yes If so, are there any without handrails?: No Home free of loose throw rugs in walkways, pet beds, electrical cords, etc?: Yes Adequate lighting in your home to reduce risk of falls?: Yes Life alert?: No Use of a cane, walker or w/c?: No Grab bars in the bathroom?: No Shower chair or bench in shower?: Yes Elevated toilet seat or a handicapped toilet?: No  TIMED UP AND GO:  Was the test performed?  No    Cognitive Function:        01/06/2023   11:00 AM 11/16/2017    1:25 PM  6CIT Screen  What Year? 0 points 0 points  What month? 0 points 0 points  What time? 0 points 0 points  Count back from 20 0 points 0 points  Months in reverse 0 points 0 points  Repeat phrase 0 points  8 points  Total Score 0 points 8 points    Immunizations Immunization History  Administered Date(s) Administered   Tdap 12/20/2018    TDAP status: Up to date  Flu Vaccine status: Declined, Education has been provided regarding the importance of this vaccine but patient still declined. Advised may receive this vaccine at local pharmacy or Health Dept. Aware to provide a copy of the vaccination record if obtained from local pharmacy or Health Dept. Verbalized acceptance and understanding.    Covid-19 vaccine status: Declined, Education has been provided regarding the importance of this vaccine but patient still declined. Advised may receive this vaccine at local  pharmacy or Health Dept.or vaccine clinic. Aware to provide a copy of the vaccination record if obtained from local pharmacy or Health Dept. Verbalized acceptance and understanding.  Qualifies for Shingles Vaccine? Yes   Zostavax completed No   Shingrix Completed?: No.    Education has been provided regarding the importance of this vaccine. Patient has been advised to call insurance company to determine out of pocket expense if they have not yet received this vaccine. Advised may also receive vaccine at local pharmacy or Health Dept. Verbalized acceptance and understanding.  Screening Tests Health Maintenance  Topic Date Due   COVID-19 Vaccine (1) Never done   Zoster Vaccines- Shingrix (1 of 2) Never done   Fecal DNA (Cologuard)  01/09/2022   Lung Cancer Screening  01/19/2022   Pneumonia Vaccine 46+ Years old (1 of 1 - PCV) 07/07/2023 (Originally 08/06/2017)   INFLUENZA VACCINE  08/01/2023 (Originally 12/02/2022)   Medicare Annual Wellness (AWV)  01/06/2024   DTaP/Tdap/Td (2 - Td or Tdap) 12/19/2028   Hepatitis C Screening  Completed   HPV VACCINES  Aged Out   Colonoscopy  Discontinued    Health Maintenance  Health Maintenance Due  Topic Date Due   COVID-19 Vaccine (1) Never done   Zoster Vaccines- Shingrix (1 of 2) Never done   Fecal DNA (Cologuard)  01/09/2022   Lung Cancer Screening  01/19/2022      Lung Cancer Screening: (Low Dose CT Chest recommended if Age 49-80 years, 20 pack-year currently smoking OR have quit w/in 15years.) does qualify. Deferred    Additional Screening:  Hepatitis C Screening: does qualify; Completed 11/16/17  Vision Screening: Recommended annual ophthalmology exams for early detection of glaucoma and other disorders of the eye. Is the patient up to date with their annual eye exam?  Yes  Who is the provider or what is the name of the office in which the patient attends annual eye exams? [facility] If pt is not established with a provider, would  they like to be referred to a provider to establish care? No .   Dental Screening: Recommended annual dental exams for proper oral hygiene    Community Resource Referral / Chronic Care Management:  CRR required this visit?  No   CCM required this visit?  No     Plan:     I have personally reviewed and noted the following in the patient's chart:   Medical and social history Use of alcohol, tobacco or illicit drugs  Current medications and supplements including opioid prescriptions. Patient is not currently taking opioid prescriptions. Functional ability and status Nutritional status Physical activity Advanced directives List of other physicians Hospitalizations, surgeries, and ER visits in previous 12 months Vitals Screenings to include cognitive, depression, and falls Referrals and appointments  In addition, I have reviewed and discussed with patient certain preventive protocols, quality  metrics, and best practice recommendations. A written personalized care plan for preventive services as well as general preventive health recommendations were provided to patient.     Tillie Rung, LPN   05/08/1094   After Visit Summary: (MyChart) Due to this being a telephonic visit, the after visit summary with patients personalized plan was offered to patient via MyChart   Nurse Notes: None

## 2023-01-06 NOTE — Patient Instructions (Addendum)
Ryan Beard , Thank you for taking time to come for your Medicare Wellness Visit. I appreciate your ongoing commitment to your health goals. Please review the following plan we discussed and let me know if I can assist you in the future.   Referrals/Orders/Follow-Ups/Clinician Recommendations:   This is a list of the screening recommended for you and due dates:  Health Maintenance  Topic Date Due   COVID-19 Vaccine (1) Never done   Zoster (Shingles) Vaccine (1 of 2) Never done   Cologuard (Stool DNA test)  01/09/2022   Screening for Lung Cancer  01/19/2022   Pneumonia Vaccine (1 of 1 - PCV) 07/07/2023*   Flu Shot  08/01/2023*   Medicare Annual Wellness Visit  01/06/2024   DTaP/Tdap/Td vaccine (2 - Td or Tdap) 12/19/2028   Hepatitis C Screening  Completed   HPV Vaccine  Aged Out   Colon Cancer Screening  Discontinued  *Topic was postponed. The date shown is not the original due date.    Advanced directives: (Copy Requested) Please bring a copy of your health care power of attorney and living will to the office to be added to your chart at your convenience.  Next Medicare Annual Wellness Visit scheduled for next year: Yes

## 2023-01-07 LAB — URINALYSIS, ROUTINE W REFLEX MICROSCOPIC
Bilirubin, UA: NEGATIVE
Glucose, UA: NEGATIVE
Leukocytes,UA: NEGATIVE
Nitrite, UA: NEGATIVE
Protein,UA: NEGATIVE
RBC, UA: NEGATIVE
Specific Gravity, UA: 1.025 (ref 1.005–1.030)
Urobilinogen, Ur: 0.2 mg/dL (ref 0.2–1.0)
pH, UA: 6.5 (ref 5.0–7.5)

## 2023-01-10 NOTE — Progress Notes (Signed)
Medical screening examination/treatment was performed by qualified clinical staff member and as primary care provider I was immediately available for consultation/collaboration. I have reviewed documentation and agree with assessment and plan.  Melida Quitter, PA

## 2023-01-12 ENCOUNTER — Ambulatory Visit (HOSPITAL_BASED_OUTPATIENT_CLINIC_OR_DEPARTMENT_OTHER): Payer: PPO | Admitting: Cardiology

## 2023-01-12 ENCOUNTER — Encounter (HOSPITAL_BASED_OUTPATIENT_CLINIC_OR_DEPARTMENT_OTHER): Payer: Self-pay | Admitting: Cardiology

## 2023-01-12 VITALS — BP 140/78 | HR 64 | Ht 69.0 in | Wt 167.7 lb

## 2023-01-12 DIAGNOSIS — I10 Essential (primary) hypertension: Secondary | ICD-10-CM

## 2023-01-12 DIAGNOSIS — Z7189 Other specified counseling: Secondary | ICD-10-CM

## 2023-01-12 DIAGNOSIS — E7800 Pure hypercholesterolemia, unspecified: Secondary | ICD-10-CM

## 2023-01-12 DIAGNOSIS — I2584 Coronary atherosclerosis due to calcified coronary lesion: Secondary | ICD-10-CM

## 2023-01-12 DIAGNOSIS — I2510 Atherosclerotic heart disease of native coronary artery without angina pectoris: Secondary | ICD-10-CM | POA: Diagnosis not present

## 2023-01-12 MED ORDER — LISINOPRIL 10 MG PO TABS
10.0000 mg | ORAL_TABLET | Freq: Every day | ORAL | 3 refills | Status: DC
Start: 2023-01-12 — End: 2024-01-30

## 2023-01-12 NOTE — Patient Instructions (Signed)
Medication Instructions:  INCREASE YOUR LISINOPRIL TO 10 MG DAILY   *If you need a refill on your cardiac medications before your next appointment, please call your pharmacy*  Lab Work: NONE  Testing/Procedures: NONE  Follow-Up: At [facility], you and your health needs are our priority.  As part of our continuing mission to provide you with exceptional heart care, we have created designated Provider Care Teams.  These Care Teams include your primary Cardiologist (physician) and Advanced Practice Providers (APPs -  Physician Assistants and Nurse Practitioners) who all work together to provide you with the care you need, when you need it.  We recommend signing up for the patient portal called "MyChart".  Sign up information is provided on this After Visit Summary.  MyChart is used to connect with patients for Virtual Visits (Telemedicine).  Patients are able to view lab/test results, encounter notes, upcoming appointments, etc.  Non-urgent messages can be sent to your provider as well.   To learn more about what you can do with MyChart, go to [email or web address].    Your next appointment:   2 years   The format for your next appointment:   In Person  Provider:   Jodelle Red, MD

## 2023-01-12 NOTE — Progress Notes (Signed)
Cardiology Office Note:  .    Date:  01/12/2023  ID:  Ryan Beard, DOB 1950-10-17, MRN [id number] PCP: Ryan Quitter, PA  [facility] Providers Cardiologist:  Ryan Red, MD     History of Present Illness: Ryan Beard    Ryan Beard is a 72 y.o. male with a hx of hyperlipidemia, distant chest pain, who is seen for follow up. He was initially seen as a new consult at the request of Ryan Masker, PA-C for the evaluation and management of cardiovascular risk on 01/29/2018.   Cardiovascular history and risk: About ~2012 he was having issues with chest pain. Had heart cath at that time (see below) without any obstructive CAD. He has made major lifestyle modifications since that time. No longer a smoker, completely changed diet, highly active. Does high exertion activities like P90X. Also has been spending significant time and research into nutrition and supplements to bolster his lifestyle. He is a former smoker of 30+ years.   At his visit 01/21/2021, he was feeling good without recurrent chest pain. Resting heart rate averaged in the 60's at home. He continued to do well with exercise. Before dinner P90X workouts, and after dinner walked 2 miles at a fast pace. Weather permitting, he walked up to 8 miles two days a week. Also worked in his garden and was chopping down trees. Blood pressure was elevated to 142/78 in the office; he was taking lisinopril 5 mg in the AM. He preferred to track his home blood pressures rather than increase his medication as he felt that his BP trended higher in the office.   Today, he states he is feeling pretty good from a cardiovascular perspective. His main concerns lately include prostate issues followed by Dr. Pete Beard.   In the office his blood pressure is 148/76 initially, and 140/78 on manual recheck. Typically he is able to feel when his blood pressure rises, feels better after lying down and calming himself. He monitors his blood pressure  at home about once a week, no low blood pressures; no lightheadedness or dizziness.  He continues to exercise every day, walking at least 2 miles sometimes up to 8 miles. No physical limitations. No anginal symptoms.  We reviewed his lipid panel from 07/2022 showing triglycerides 128, HDL 49, LDL 98. Continues to alternate his daily rosuvastatin between 10 mg and 20 mg.  He denies any palpitations, chest pain, shortness of breath, peripheral edema, headaches, syncope, orthopnea, or PND.  ROS:  Please see the history of present illness. ROS otherwise negative except as noted.   Studies Reviewed: Ryan Beard    EKG Interpretation Date/Time:  Wednesday January 12 2023 15:49:49 EDT Ventricular Rate:  64 PR Interval:  150 QRS Duration:  78 QT Interval:  376 QTC Calculation: 387 R Axis:   50  Text Interpretation: Normal sinus rhythm Normal ECG Confirmed by Ryan Beard [phone]) on 01/12/2023 4:13:49 PM    Physical Exam:    VS:  BP (!) 140/78 (BP Location: Left Arm, Patient Position: Sitting, Cuff Size: Normal)   Pulse 64   Ht 5\' 9"  (1.753 m)   Wt 167 lb 11.2 oz (76.1 kg)   BMI 24.76 kg/m    Wt Readings from Last 3 Encounters:  01/12/23 167 lb 11.2 oz (76.1 kg)  01/06/23 165 lb (74.8 kg)  01/05/23 166 lb 12.8 oz (75.7 kg)    GEN: Well nourished, well developed in no acute distress HEENT: Normal, moist mucous membranes NECK: No JVD CARDIAC:  regular rhythm, normal S1 and S2, no rubs or gallops. No murmur. VASCULAR: Radial and DP pulses 2+ bilaterally. No carotid bruits RESPIRATORY:  Clear to auscultation without rales, wheezing or rhonchi  ABDOMEN: Soft, non-tender, non-distended MUSCULOSKELETAL:  Ambulates independently SKIN: Warm and dry, no edema NEUROLOGIC:  Alert and oriented x 3. No focal neuro deficits noted. PSYCHIATRIC:  Normal affect   ASSESSMENT AND PLAN: .    coronary artery calcification, consistent with nonobstructive CAD hypercholesterolemia -prior cath 2012  with nonobstructive disease -excellent lifestyle -on many supplements, per personal preference, see prior discussion -alternates rosuvastatin 20 mg with 10 mg dose daily, feels that he tolerates this better than 20 mg every day -per [facility], last LDL 98 -LDL goal <100 at least or ideally <70   Hypertension:  -Elevated in office today. Discussed goal of <130/80. Reports better control at home -continue lisinopril 5 mg. If BP consistently >130/80 at home, would increase to 10 mg lisinopril daily. He will let me know if it is elevated at home   Prevention: -recommend heart healthy/Mediterranean diet, with whole grains, fruits, vegetable, fish, lean meats, nuts, and olive oil. Limit salt. -he is already doing very high level activity, encouraged this. -recommend avoidance of tobacco products. Avoid excess alcohol.  Dispo: Follow-up in 2 years, or sooner as needed.  I,Ryan Beard,acting as a Ryan Beard for [employer], MD.,have documented all relevant documentation on the behalf of Ryan Red, MD,as directed by  Ryan Red, MD while in the presence of Ryan Red, MD.  I, Ryan Red, MD, have reviewed all documentation for this visit. The documentation on 02/28/23 for the exam, diagnosis, procedures, and orders are all accurate and complete.   Signed, Ryan Red, MD

## 2023-01-13 ENCOUNTER — Telehealth: Payer: Self-pay | Admitting: Urology

## 2023-01-13 NOTE — Telephone Encounter (Signed)
Pt calling on the status of referral Dr Pete Glatter was working on to another Insurance underwriter in [place]. Would like the name and phone number of that doctors office as well.

## 2023-01-13 NOTE — Telephone Encounter (Signed)
Called a second time regarding referral.

## 2023-01-26 ENCOUNTER — Ambulatory Visit (HOSPITAL_BASED_OUTPATIENT_CLINIC_OR_DEPARTMENT_OTHER): Payer: PPO | Admitting: Cardiology

## 2023-02-01 ENCOUNTER — Encounter: Payer: Self-pay | Admitting: Urology

## 2023-02-01 ENCOUNTER — Ambulatory Visit: Payer: PPO | Admitting: Urology

## 2023-02-01 ENCOUNTER — Other Ambulatory Visit: Payer: Self-pay

## 2023-02-01 VITALS — BP 149/80 | HR 74 | Ht 69.0 in | Wt 166.0 lb

## 2023-02-01 DIAGNOSIS — R310 Gross hematuria: Secondary | ICD-10-CM | POA: Diagnosis not present

## 2023-02-01 DIAGNOSIS — N401 Enlarged prostate with lower urinary tract symptoms: Secondary | ICD-10-CM | POA: Diagnosis not present

## 2023-02-01 DIAGNOSIS — N138 Other obstructive and reflux uropathy: Secondary | ICD-10-CM

## 2023-02-01 NOTE — H&P (View-Only) (Signed)
02/01/23 1:59 PM   Ryan Beard Feb 21, 1951 [id number]  CC: BPH, LUTS, elevated PSA  HPI: 72 year old male referred from Dr. Pete Glatter for consideration of HOLEP.  He has had recurrent gross hematuria worked up by Dr. Pete Glatter and felt to be secondary to small bladder stone as well as friable prostate vessels, with CT showing a 170 g prostate, as well as a 1 cm bladder stone.  He has long-term urinary symptoms with weak stream despite Flomax.  He also has a history of an elevated PSA as high as 16, has had extensive prior workup including benign prostate MRI in 2018 and 2021, negative prostate biopsy 2018.  He is on Flomax, stopped taking finasteride secondary to bothersome side effects.   PMH: Past Medical History:  Diagnosis Date   Abnormal PSA    Hyperlipidemia     Surgical History: Past Surgical History:  Procedure Laterality Date   APPENDECTOMY  1973   CARDIAC CATHETERIZATION     PROSTATE BIOPSY     SEPTOPLASTY  1960   TONSILLECTOMY  1960     Family History: Family History  Problem Relation Age of Onset   Heart disease Mother    Cancer Father        lymphoma   Hypertension Father    Alcohol abuse Sister    Colon cancer Neg Hx    Esophageal cancer Neg Hx    Rectal cancer Neg Hx    Stomach cancer Neg Hx    Prostate cancer Neg Hx     Social History:  reports that he quit smoking about 17 years ago. His smoking use included cigarettes. He started smoking about 47 years ago. He has a 30 pack-year smoking history. He has never been exposed to tobacco smoke. He has never used smokeless tobacco. He reports current alcohol use of about 1.0 standard drink of alcohol per week. He reports that he does not use drugs.  Physical Exam: BP (!) 149/80   Pulse 74   Ht 5\' 9"  (1.753 m)   Wt 166 lb (75.3 kg)   BMI 24.51 kg/m    Constitutional:  Alert and oriented, No acute distress. Cardiovascular: No clubbing, cyanosis, or edema. Respiratory: Normal respiratory effort,  no increased work of breathing. GI: Abdomen is soft, nontender, nondistended, no abdominal masses    Pertinent Imaging: I have personally viewed and interpreted the CT showing a prostate measuring 170 g, as well as the prior benign prostate MRIs.  Assessment & Plan:   72 year old male with ongoing intermittent gross hematuria felt to be secondary to BPH, as well as a bladder stone.  Significantly elevated PSA up to 16 in the past but extensive negative workup including negative biopsy in 2018, benign prostate MRI x 2.  We reviewed treatment options including maximal medical therapy with Flomax and finasteride, aqua ablation, or prostate artery embolization.  Risk and benefits discussed at length, as well as HOLEP, and that HOLEP is considered the gold standard for prostates over 100 g.  We discussed the risks and benefits of HoLEP at length.  The procedure requires general anesthesia and takes 1 to 2 hours, and a holmium laser is used to enucleate the prostate and push this tissue into the bladder.  A morcellator is then used to remove this tissue, which is sent for pathology.  The vast majority(>95%) of patients are able to discharge the same day with a catheter in place for 2 to 3 days, and will follow-up in clinic for a  voiding trial.  We specifically discussed the risks of bleeding, infection, retrograde ejaculation, temporary urgency and urge incontinence, very low risk of long-term incontinence, urethral stricture/bladder neck contracture, pathologic evaluation of prostate tissue and possible detection of prostate cancer or other malignancy, and possible need for additional procedures.  Schedule HOLEP and cystolitholapaxy  Legrand Rams, MD 02/01/2023  [facility] [address], [address] [place], [place] [address] [phone]

## 2023-02-01 NOTE — Patient Instructions (Addendum)

## 2023-02-01 NOTE — Progress Notes (Signed)
02/01/23 1:59 PM   Ryan Beard Feb 21, 1951 [id number]  CC: BPH, LUTS, elevated PSA  HPI: 72 year old male referred from Dr. Pete Glatter for consideration of HOLEP.  He has had recurrent gross hematuria worked up by Dr. Pete Glatter and felt to be secondary to small bladder stone as well as friable prostate vessels, with CT showing a 170 g prostate, as well as a 1 cm bladder stone.  He has long-term urinary symptoms with weak stream despite Flomax.  He also has a history of an elevated PSA as high as 16, has had extensive prior workup including benign prostate MRI in 2018 and 2021, negative prostate biopsy 2018.  He is on Flomax, stopped taking finasteride secondary to bothersome side effects.   PMH: Past Medical History:  Diagnosis Date   Abnormal PSA    Hyperlipidemia     Surgical History: Past Surgical History:  Procedure Laterality Date   APPENDECTOMY  1973   CARDIAC CATHETERIZATION     PROSTATE BIOPSY     SEPTOPLASTY  1960   TONSILLECTOMY  1960     Family History: Family History  Problem Relation Age of Onset   Heart disease Mother    Cancer Father        lymphoma   Hypertension Father    Alcohol abuse Sister    Colon cancer Neg Hx    Esophageal cancer Neg Hx    Rectal cancer Neg Hx    Stomach cancer Neg Hx    Prostate cancer Neg Hx     Social History:  reports that he quit smoking about 17 years ago. His smoking use included cigarettes. He started smoking about 47 years ago. He has a 30 pack-year smoking history. He has never been exposed to tobacco smoke. He has never used smokeless tobacco. He reports current alcohol use of about 1.0 standard drink of alcohol per week. He reports that he does not use drugs.  Physical Exam: BP (!) 149/80   Pulse 74   Ht 5\' 9"  (1.753 m)   Wt 166 lb (75.3 kg)   BMI 24.51 kg/m    Constitutional:  Alert and oriented, No acute distress. Cardiovascular: No clubbing, cyanosis, or edema. Respiratory: Normal respiratory effort,  no increased work of breathing. GI: Abdomen is soft, nontender, nondistended, no abdominal masses    Pertinent Imaging: I have personally viewed and interpreted the CT showing a prostate measuring 170 g, as well as the prior benign prostate MRIs.  Assessment & Plan:   72 year old male with ongoing intermittent gross hematuria felt to be secondary to BPH, as well as a bladder stone.  Significantly elevated PSA up to 16 in the past but extensive negative workup including negative biopsy in 2018, benign prostate MRI x 2.  We reviewed treatment options including maximal medical therapy with Flomax and finasteride, aqua ablation, or prostate artery embolization.  Risk and benefits discussed at length, as well as HOLEP, and that HOLEP is considered the gold standard for prostates over 100 g.  We discussed the risks and benefits of HoLEP at length.  The procedure requires general anesthesia and takes 1 to 2 hours, and a holmium laser is used to enucleate the prostate and push this tissue into the bladder.  A morcellator is then used to remove this tissue, which is sent for pathology.  The vast majority(>95%) of patients are able to discharge the same day with a catheter in place for 2 to 3 days, and will follow-up in clinic for a  voiding trial.  We specifically discussed the risks of bleeding, infection, retrograde ejaculation, temporary urgency and urge incontinence, very low risk of long-term incontinence, urethral stricture/bladder neck contracture, pathologic evaluation of prostate tissue and possible detection of prostate cancer or other malignancy, and possible need for additional procedures.  Schedule HOLEP and cystolitholapaxy  Legrand Rams, MD 02/01/2023  [facility] [address], [address] [place], [place] [address] [phone]

## 2023-02-01 NOTE — Progress Notes (Unsigned)
Surgical Physician Order Form [facility]  Dr. Legrand Rams, MD  * Scheduling expectation : Next Available  *Length of Case: 2 hours  *Clearance needed: no  *Anticoagulation Instructions: Hold all anticoagulants  *Aspirin Instructions: Hold Aspirin  *Post-op visit Date/Instructions:  1-3 day cath removal  *Diagnosis: BPH w/urinary obstruction  *Procedure:  HOLEP (08657) with cystolitholopaxy(1cm stone)   Additional orders: N/A  -Admit type: OUTpatient  -Anesthesia: General  -VTE Prophylaxis Standing Order SCD's       Other:   -Standing Lab Orders Per Anesthesia    Lab other: UA&Urine Culture  -Standing Test orders EKG/Chest x-ray per Anesthesia       Test other:   - Medications:  Ancef 2gm IV  -Other orders:  N/A

## 2023-02-02 ENCOUNTER — Telehealth: Payer: Self-pay

## 2023-02-02 NOTE — Telephone Encounter (Signed)
Per Dr. Richardo Hanks, Patient is to be scheduled for  Holmium Laser Enucleation of the Prostate with Cystolitholapaxy   Ryan Beard was contacted and possible surgical dates were discussed, Friday October 18th, 2024 was agreed upon for surgery.   Patient was instructed that Dr. Richardo Hanks will require them to provide a pre-op UA & CX prior to surgery. This was ordered and scheduled drop off appointment was made for 02/18/2023.    Patient was directed to call [phone] between 1-3pm the day before surgery to find out surgical arrival time.  Instructions were given not to eat or drink from midnight on the night before surgery and have a driver for the day of surgery. On the surgery day patient was instructed to enter through the Medical Mall entrance of [facility] report the Same Day Surgery desk.   Pre-Admit Testing will be in contact via phone to set up an interview with the anesthesia team to review your history and medications prior to surgery.   Reminder of this information was sent via MyChart to the patient.

## 2023-02-02 NOTE — Progress Notes (Signed)
   [facility] Surgical Posting Form  Surgery Date: Date: 02/18/2023  Surgeon: Dr. Legrand Rams, MD  Inpt ( No  )   Outpt (Yes)   Obs ( No  )   Diagnosis: N40.1, N13.8 Benign Prostatic Hyperplasia with Urinary Obstruction   -CPT: 16109, 60454  Surgery: Holmium Laser Enucleation of the Prostate with Cystolitholapaxy  Stop Anticoagulations: Yes and also hold ASA  Cardiac/Medical/Pulmonary Clearance needed: no  *Orders entered into EPIC  Date: 02/02/23   *Case booked in EPIC  Date: 02/02/23  *Notified pt of Surgery: Date: 02/02/23  PRE-OP UA & CX: yes, will obtain and [facility] lab on 02/07/2023  *Placed into Prior Authorization Work Angela Nevin Date: 02/02/23  Assistant/laser/rep:No

## 2023-02-07 ENCOUNTER — Other Ambulatory Visit: Payer: PPO

## 2023-02-07 ENCOUNTER — Other Ambulatory Visit
Admission: RE | Admit: 2023-02-07 | Discharge: 2023-02-07 | Disposition: A | Discharge status: Home / Self Care | Payer: PPO | Attending: Urology | Admitting: Urology

## 2023-02-07 DIAGNOSIS — N138 Other obstructive and reflux uropathy: Secondary | ICD-10-CM | POA: Diagnosis not present

## 2023-02-07 DIAGNOSIS — N401 Enlarged prostate with lower urinary tract symptoms: Secondary | ICD-10-CM | POA: Diagnosis not present

## 2023-02-07 LAB — URINALYSIS, COMPLETE (UACMP) WITH MICROSCOPIC

## 2023-02-08 LAB — URINE CULTURE: Culture: NO GROWTH

## 2023-02-09 ENCOUNTER — Encounter
Admission: RE | Admit: 2023-02-09 | Discharge: 2023-02-09 | Disposition: A | Discharge status: Home / Self Care | Payer: PPO | Source: Ambulatory Visit | Attending: Urology | Admitting: Urology

## 2023-02-09 ENCOUNTER — Other Ambulatory Visit: Payer: Self-pay

## 2023-02-09 ENCOUNTER — Other Ambulatory Visit: Payer: Self-pay | Admitting: Family Medicine

## 2023-02-09 VITALS — Ht 69.0 in | Wt 164.0 lb

## 2023-02-09 DIAGNOSIS — I10 Essential (primary) hypertension: Secondary | ICD-10-CM

## 2023-02-09 DIAGNOSIS — Z01812 Encounter for preprocedural laboratory examination: Secondary | ICD-10-CM

## 2023-02-09 DIAGNOSIS — Z01818 Encounter for other preprocedural examination: Secondary | ICD-10-CM

## 2023-02-09 HISTORY — DX: Essential (primary) hypertension: I10

## 2023-02-09 NOTE — Patient Instructions (Addendum)
Your procedure is scheduled on: Friday October 18  Report to the Registration Desk on the 1st floor of the [facility] To find out your arrival time, please call [phone] between 1PM - 3PM on:  Thursday October 17  If your arrival time is 6:00 am, do not arrive before that time as the Medical Mall entrance doors do not open until 6:00 am.  REMEMBER: Instructions that are not followed completely may result in serious medical risk, up to and including death; or upon the discretion of your surgeon and anesthesiologist your surgery may need to be rescheduled.  Do not eat food after midnight the night before surgery.  No gum chewing or hard candies.  One week prior to surgery:Friday October 11  Stop Anti-inflammatories (NSAIDS) such as Advil, Aleve, Ibuprofen, Motrin, Naproxen, Naprosyn and Aspirin based products such as Excedrin, Goody's Powder, BC Powder.   Stop ANY OVER THE COUNTER supplements until after surgery. Acetylcysteine (N-ACETYL-L-CYSTEINE PO)  BLACK PEPPER-TURMERIC  CHELATED MAGNESIUM  Cholecalciferol (VITAMIN D-3)  Cyanocobalamin (B-12)  Krill Oil  L-Arginine  Pregnenolone POWD  saw palmetto  K2 MK7  Olipure BP  I-Throid Iodine  ZMA b6 magnesium and zinc  vitamin C (ASCORBIC ACID)  vitamin E   You may however, continue to take Tylenol if needed for pain up until the day of surgery.  Continue taking all prescribed medications with the exception of the following: lisinopril (ZESTRIL) hold the day of surgery, last dose Thursday October 17  tadalafil (CIALIS) hold 2 days prior to surgery, last dose Tuesday October 15  TAKE NO MEDICATIONS THE MORNING OF SURGERY.    No Alcohol for 24 hours before or after surgery.  No Smoking including e-cigarettes for 24 hours before surgery.  No chewable tobacco products for at least 6 hours before surgery.  No nicotine patches on the day of surgery.  Do not use any "recreational" drugs for at least a week (preferably 2  weeks) before your surgery.  Please be advised that the combination of cocaine and anesthesia may have negative outcomes, up to and including death. If you test positive for cocaine, your surgery will be cancelled.  On the morning of surgery brush your teeth with toothpaste and water, you may rinse your mouth with mouthwash if you wish. Do not swallow any toothpaste or mouthwash.  Do not wear jewelry, make-up, hairpins, clips or nail polish.  For welded (permanent) jewelry: bracelets, anklets, waist bands, etc.  Please have this removed prior to surgery.  If it is not removed, there is a chance that hospital personnel will need to cut it off on the day of surgery.  Do not wear lotions, powders, or perfumes.   Do not shave body hair from the neck down 48 hours before surgery.  Contact lenses, hearing aids and dentures may not be worn into surgery.  Do not bring valuables to the hospital. [facility] is not responsible for any missing/lost belongings or valuables.    Notify your doctor if there is any change in your medical condition (cold, fever, infection).  Wear comfortable clothing (specific to your surgery type) to the hospital.  After surgery, you can help prevent lung complications by doing breathing exercises.  Take deep breaths and cough every 1-2 hours.   If you are being discharged the day of surgery, you will not be allowed to drive home. You will need a responsible individual to drive you home and stay with you for 24 hours after surgery.  If you are taking public transportation, you will need to have a responsible individual with you.  Please call the Pre-admissions Testing Dept. at [phone] if you have any questions about these instructions.  Surgery Visitation Policy:  Patients having surgery or a procedure may have two visitors.  Children under the age of 76 must have an adult with them who is not the patient.

## 2023-02-10 ENCOUNTER — Inpatient Hospital Stay: Admission: RE | Admit: 2023-02-10 | Payer: PPO | Source: Ambulatory Visit

## 2023-02-10 ENCOUNTER — Other Ambulatory Visit: Payer: PPO

## 2023-02-10 DIAGNOSIS — Z01818 Encounter for other preprocedural examination: Secondary | ICD-10-CM | POA: Diagnosis not present

## 2023-02-10 DIAGNOSIS — E7800 Pure hypercholesterolemia, unspecified: Secondary | ICD-10-CM | POA: Diagnosis not present

## 2023-02-10 DIAGNOSIS — I10 Essential (primary) hypertension: Secondary | ICD-10-CM | POA: Diagnosis not present

## 2023-02-10 DIAGNOSIS — Z833 Family history of diabetes mellitus: Secondary | ICD-10-CM | POA: Diagnosis not present

## 2023-02-10 DIAGNOSIS — Z131 Encounter for screening for diabetes mellitus: Secondary | ICD-10-CM

## 2023-02-11 LAB — HEMOGLOBIN A1C
Est. average glucose Bld gHb Est-mCnc: 111 mg/dL
Hgb A1c MFr Bld: 5.5 % (ref 4.8–5.6)

## 2023-02-11 LAB — CBC WITH DIFFERENTIAL/PLATELET
Basophils Absolute: 0 10*3/uL (ref 0.0–0.2)
Basos: 1 %
EOS (ABSOLUTE): 0.2 10*3/uL (ref 0.0–0.4)
Eos: 4 %
Hematocrit: 43 % (ref 37.5–51.0)
Hemoglobin: 14.2 g/dL (ref 13.0–17.7)
Immature Grans (Abs): 0 10*3/uL (ref 0.0–0.1)
Immature Granulocytes: 0 %
Lymphocytes Absolute: 2 10*3/uL (ref 0.7–3.1)
Lymphs: 29 %
MCH: 30.5 pg (ref 26.6–33.0)
MCHC: 33 g/dL (ref 31.5–35.7)
MCV: 92 fL (ref 79–97)
Monocytes Absolute: 0.5 10*3/uL (ref 0.1–0.9)
Monocytes: 8 %
Neutrophils Absolute: 4 10*3/uL (ref 1.4–7.0)
Neutrophils: 58 %
Platelets: 197 10*3/uL (ref 150–450)
RBC: 4.66 x10E6/uL (ref 4.14–5.80)
RDW: 12.5 % (ref 11.6–15.4)
WBC: 6.8 10*3/uL (ref 3.4–10.8)

## 2023-02-11 LAB — COMPREHENSIVE METABOLIC PANEL
ALT: 23 [IU]/L (ref 0–44)
AST: 20 [IU]/L (ref 0–40)
Albumin: 4.6 g/dL (ref 3.9–4.9)
Alkaline Phosphatase: 46 [IU]/L (ref 44–121)
BUN/Creatinine Ratio: 16 (ref 10–24)
BUN: 14 mg/dL (ref 8–27)
Bilirubin Total: 0.3 mg/dL (ref 0.0–1.2)
CO2: 21 mmol/L (ref 20–29)
Calcium: 9.4 mg/dL (ref 8.6–10.2)
Chloride: 105 mmol/L (ref 96–106)
Creatinine, Ser: 0.89 mg/dL (ref 0.76–1.27)
Globulin, Total: 2 g/dL (ref 1.5–4.5)
Glucose: 89 mg/dL (ref 70–99)
Potassium: 4.7 mmol/L (ref 3.5–5.2)
Sodium: 142 mmol/L (ref 134–144)
Total Protein: 6.6 g/dL (ref 6.0–8.5)
eGFR: 92 mL/min/{1.73_m2} (ref >59)

## 2023-02-11 LAB — LIPID PANEL
Chol/HDL Ratio: 3.7 {ratio} (ref 0.0–5.0)
Cholesterol, Total: 158 mg/dL (ref 100–199)
HDL: 43 mg/dL (ref >39)
LDL Chol Calc (NIH): 91 mg/dL (ref 0–99)
Triglycerides: 137 mg/dL (ref 0–149)
VLDL Cholesterol Cal: 24 mg/dL (ref 5–40)

## 2023-02-18 ENCOUNTER — Other Ambulatory Visit: Payer: Self-pay

## 2023-02-18 ENCOUNTER — Ambulatory Visit
Admission: RE | Admit: 2023-02-18 | Discharge: 2023-02-18 | Disposition: A | Discharge status: Home / Self Care | Payer: PPO | Attending: Urology | Admitting: Urology

## 2023-02-18 ENCOUNTER — Ambulatory Visit: Payer: PPO | Admitting: Urgent Care

## 2023-02-18 ENCOUNTER — Encounter
Admission: RE | Disposition: A | Discharge status: Home / Self Care | Payer: Self-pay | Source: Home / Self Care | Attending: Urology

## 2023-02-18 ENCOUNTER — Encounter: Payer: Self-pay | Admitting: Urology

## 2023-02-18 DIAGNOSIS — Z87891 Personal history of nicotine dependence: Secondary | ICD-10-CM | POA: Insufficient documentation

## 2023-02-18 DIAGNOSIS — N401 Enlarged prostate with lower urinary tract symptoms: Secondary | ICD-10-CM | POA: Diagnosis not present

## 2023-02-18 DIAGNOSIS — Z8249 Family history of ischemic heart disease and other diseases of the circulatory system: Secondary | ICD-10-CM | POA: Diagnosis not present

## 2023-02-18 DIAGNOSIS — N139 Obstructive and reflux uropathy, unspecified: Secondary | ICD-10-CM | POA: Diagnosis not present

## 2023-02-18 DIAGNOSIS — N411 Chronic prostatitis: Secondary | ICD-10-CM | POA: Diagnosis not present

## 2023-02-18 DIAGNOSIS — I10 Essential (primary) hypertension: Secondary | ICD-10-CM | POA: Diagnosis not present

## 2023-02-18 DIAGNOSIS — I2510 Atherosclerotic heart disease of native coronary artery without angina pectoris: Secondary | ICD-10-CM | POA: Diagnosis not present

## 2023-02-18 DIAGNOSIS — N210 Calculus in bladder: Secondary | ICD-10-CM | POA: Insufficient documentation

## 2023-02-18 DIAGNOSIS — N138 Other obstructive and reflux uropathy: Secondary | ICD-10-CM | POA: Diagnosis not present

## 2023-02-18 HISTORY — PX: CYSTOSCOPY WITH LITHOLAPAXY: SHX1425

## 2023-02-18 HISTORY — PX: HOLEP-LASER ENUCLEATION OF THE PROSTATE WITH MORCELLATION: SHX6641

## 2023-02-18 SURGERY — ENUCLEATION, PROSTATE, USING LASER, WITH MORCELLATION
Anesthesia: General | Site: Prostate

## 2023-02-18 MED ORDER — FENTANYL CITRATE (PF) 100 MCG/2ML IJ SOLN: 25.0000 ug | INTRAMUSCULAR | Status: DC | PRN

## 2023-02-18 MED ORDER — PROPOFOL 1000 MG/100ML IV EMUL
INTRAVENOUS | Status: AC
  Filled 2023-02-18: qty 200

## 2023-02-18 MED ORDER — CHLORHEXIDINE GLUCONATE 0.12 % MT SOLN
15.0000 mL | Freq: Once | OROMUCOSAL | Status: AC
  Administered 2023-02-18: 15 mL via OROMUCOSAL

## 2023-02-18 MED ORDER — ONDANSETRON HCL 4 MG/2ML IJ SOLN: 4.0000 mg | Freq: Once | INTRAMUSCULAR | Status: DC | PRN

## 2023-02-18 MED ORDER — DEXMEDETOMIDINE HCL IN NACL 200 MCG/50ML IV SOLN
INTRAVENOUS | Status: DC | PRN
Start: 2023-02-18 — End: 2023-02-18
  Administered 2023-02-18: 12 ug via INTRAVENOUS

## 2023-02-18 MED ORDER — ONDANSETRON HCL 4 MG/2ML IJ SOLN
INTRAMUSCULAR | Status: AC
  Filled 2023-02-18: qty 20

## 2023-02-18 MED ORDER — SUCCINYLCHOLINE CHLORIDE 200 MG/10ML IV SOSY
PREFILLED_SYRINGE | INTRAVENOUS | Status: AC
  Filled 2023-02-18: qty 50

## 2023-02-18 MED ORDER — GLYCOPYRROLATE 0.2 MG/ML IJ SOLN
INTRAMUSCULAR | Status: DC | PRN
  Administered 2023-02-18: .2 mg via INTRAVENOUS

## 2023-02-18 MED ORDER — LIDOCAINE HCL (PF) 2 % IJ SOLN
INTRAMUSCULAR | Status: AC
  Filled 2023-02-18: qty 25

## 2023-02-18 MED ORDER — ORAL CARE MOUTH RINSE: 15.0000 mL | Freq: Once | OROMUCOSAL | Status: AC

## 2023-02-18 MED ORDER — ACETAMINOPHEN 10 MG/ML IV SOLN
INTRAVENOUS | Status: DC | PRN
  Administered 2023-02-18: 1000 mg via INTRAVENOUS

## 2023-02-18 MED ORDER — ROCURONIUM BROMIDE 100 MG/10ML IV SOLN
INTRAVENOUS | Status: DC | PRN
  Administered 2023-02-18: 50 mg via INTRAVENOUS
  Administered 2023-02-18: 10 mg via INTRAVENOUS
  Administered 2023-02-18: 20 mg via INTRAVENOUS

## 2023-02-18 MED ORDER — SUCCINYLCHOLINE CHLORIDE 200 MG/10ML IV SOSY
PREFILLED_SYRINGE | INTRAVENOUS | Status: DC | PRN
  Administered 2023-02-18: 100 mg via INTRAVENOUS

## 2023-02-18 MED ORDER — EPHEDRINE 5 MG/ML INJ
INTRAVENOUS | Status: AC
  Filled 2023-02-18: qty 5

## 2023-02-18 MED ORDER — MIDAZOLAM HCL 2 MG/2ML IJ SOLN
INTRAMUSCULAR | Status: DC | PRN
  Administered 2023-02-18: 2 mg via INTRAVENOUS

## 2023-02-18 MED ORDER — DEXAMETHASONE SODIUM PHOSPHATE 10 MG/ML IJ SOLN
INTRAMUSCULAR | Status: AC
  Filled 2023-02-18: qty 5

## 2023-02-18 MED ORDER — CEFAZOLIN SODIUM-DEXTROSE 2-4 GM/100ML-% IV SOLN
2.0000 g | INTRAVENOUS | Status: AC
  Administered 2023-02-18: 2 g via INTRAVENOUS

## 2023-02-18 MED ORDER — ONDANSETRON HCL 4 MG/2ML IJ SOLN
INTRAMUSCULAR | Status: DC | PRN
  Administered 2023-02-18 (×2): 4 mg via INTRAVENOUS

## 2023-02-18 MED ORDER — SUGAMMADEX SODIUM 200 MG/2ML IV SOLN
INTRAVENOUS | Status: DC | PRN
  Administered 2023-02-18: 200 mg via INTRAVENOUS

## 2023-02-18 MED ORDER — SODIUM CHLORIDE 0.9 % IR SOLN
Status: DC | PRN
  Administered 2023-02-18: 12000 mL
  Administered 2023-02-18: 3000 mL
  Administered 2023-02-18: 6000 mL

## 2023-02-18 MED ORDER — PROPOFOL 10 MG/ML IV BOLUS
INTRAVENOUS | Status: DC | PRN
  Administered 2023-02-18: 20 mg via INTRAVENOUS
  Administered 2023-02-18: 150 mg via INTRAVENOUS

## 2023-02-18 MED ORDER — DEXAMETHASONE SODIUM PHOSPHATE 10 MG/ML IJ SOLN
INTRAMUSCULAR | Status: DC | PRN
  Administered 2023-02-18: 10 mg via INTRAVENOUS

## 2023-02-18 MED ORDER — MIDAZOLAM HCL 2 MG/2ML IJ SOLN
INTRAMUSCULAR | Status: AC
  Filled 2023-02-18: qty 2

## 2023-02-18 MED ORDER — PHENYLEPHRINE 80 MCG/ML (10ML) SYRINGE FOR IV PUSH (FOR BLOOD PRESSURE SUPPORT)
PREFILLED_SYRINGE | INTRAVENOUS | Status: AC
  Filled 2023-02-18: qty 30

## 2023-02-18 MED ORDER — LIDOCAINE HCL (CARDIAC) PF 100 MG/5ML IV SOSY
PREFILLED_SYRINGE | INTRAVENOUS | Status: DC | PRN
  Administered 2023-02-18: 100 mg via INTRAVENOUS

## 2023-02-18 MED ORDER — CHLORHEXIDINE GLUCONATE 0.12 % MT SOLN
OROMUCOSAL | Status: AC
  Filled 2023-02-18: qty 15

## 2023-02-18 MED ORDER — FENTANYL CITRATE (PF) 100 MCG/2ML IJ SOLN
INTRAMUSCULAR | Status: AC
  Filled 2023-02-18: qty 2

## 2023-02-18 MED ORDER — CEFAZOLIN SODIUM-DEXTROSE 2-4 GM/100ML-% IV SOLN
INTRAVENOUS | Status: AC
  Filled 2023-02-18: qty 100

## 2023-02-18 MED ORDER — GLYCOPYRROLATE 0.2 MG/ML IJ SOLN
INTRAMUSCULAR | Status: AC
  Filled 2023-02-18: qty 5

## 2023-02-18 MED ORDER — ROCURONIUM BROMIDE 10 MG/ML (PF) SYRINGE
PREFILLED_SYRINGE | INTRAVENOUS | Status: AC
  Filled 2023-02-18: qty 10

## 2023-02-18 MED ORDER — LACTATED RINGERS IV SOLN: INTRAVENOUS | Status: DC

## 2023-02-18 MED ORDER — FENTANYL CITRATE (PF) 100 MCG/2ML IJ SOLN
INTRAMUSCULAR | Status: DC | PRN
  Administered 2023-02-18 (×2): 50 ug via INTRAVENOUS

## 2023-02-18 MED ORDER — EPHEDRINE SULFATE-NACL 50-0.9 MG/10ML-% IV SOSY
PREFILLED_SYRINGE | INTRAVENOUS | Status: DC | PRN
  Administered 2023-02-18: 5 mg via INTRAVENOUS

## 2023-02-18 MED ORDER — TRAMADOL HCL 50 MG PO TABS
25.0000 mg | ORAL_TABLET | Freq: Four times a day (QID) | ORAL | 0 refills | Status: AC | PRN
Start: 2023-02-18 — End: 2023-02-21

## 2023-02-18 MED ORDER — PHENYLEPHRINE 80 MCG/ML (10ML) SYRINGE FOR IV PUSH (FOR BLOOD PRESSURE SUPPORT)
PREFILLED_SYRINGE | INTRAVENOUS | Status: DC | PRN
  Administered 2023-02-18 (×2): 160 ug via INTRAVENOUS

## 2023-02-18 MED ORDER — ROCURONIUM BROMIDE 10 MG/ML (PF) SYRINGE
PREFILLED_SYRINGE | INTRAVENOUS | Status: AC
  Filled 2023-02-18: qty 40

## 2023-02-18 SURGICAL SUPPLY — 43 items
ADAPTER IRRIG TUBE 2 SPIKE SOL (ADAPTER) ×4 IMPLANT
ADPR TBG 2 SPK PMP STRL ASCP (ADAPTER) ×4
BAG DRAIN SIEMENS DORNER NS (MISCELLANEOUS) ×2 IMPLANT
BAG DRN LRG CPC RND TRDRP CNTR (MISCELLANEOUS) ×2
BAG DRN NS LF (MISCELLANEOUS) ×2
BAG DRN RND TRDRP ANRFLXCHMBR (UROLOGICAL SUPPLIES) ×2
BAG URINE DRAIN 2000ML AR STRL (UROLOGICAL SUPPLIES) ×2 IMPLANT
BAG URO DRAIN 4000ML (MISCELLANEOUS) ×2 IMPLANT
CATH FOLEY 3WAY 30CC 24FR (CATHETERS) ×2
CATH URETL OPEN END 4X70 (CATHETERS) ×2 IMPLANT
CATH URTH STD 24FR FL 3W 2 (CATHETERS) ×2 IMPLANT
CNTNR URN SCR LID CUP LEK RST (MISCELLANEOUS) IMPLANT
CONT SPEC 4OZ STRL OR WHT (MISCELLANEOUS)
CONTAINER COLLECT MORCELLATR (MISCELLANEOUS) ×2 IMPLANT
DRAPE UTILITY 15X26 TOWEL STRL (DRAPES) IMPLANT
ELECT BIVAP BIPO 22/24 DONUT (ELECTROSURGICAL)
ELECTRD BIVAP BIPO 22/24 DONUT (ELECTROSURGICAL) IMPLANT
FIBER LASER MOSES 200 DFL (Laser) ×2 IMPLANT
FIBER LASER MOSES 550 DFL (Laser) ×2 IMPLANT
FILTER OVERFLOW MORCELLATOR (FILTER) ×2 IMPLANT
GLOVE BIOGEL PI IND STRL 7.5 (GLOVE) ×2 IMPLANT
GOWN STRL REUS W/ TWL LRG LVL3 (GOWN DISPOSABLE) ×2 IMPLANT
GOWN STRL REUS W/ TWL XL LVL3 (GOWN DISPOSABLE) ×2 IMPLANT
GOWN STRL REUS W/TWL LRG LVL3 (GOWN DISPOSABLE) ×2
GOWN STRL REUS W/TWL XL LVL3 (GOWN DISPOSABLE) ×2
HOLDER FOLEY CATH W/STRAP (MISCELLANEOUS) ×2 IMPLANT
IV NS IRRIG 3000ML ARTHROMATIC (IV SOLUTION) ×10 IMPLANT
KIT TURNOVER CYSTO (KITS) ×2 IMPLANT
MBRN O SEALING YLW 17 FOR INST (MISCELLANEOUS) ×2
MEMBRANE SLNG YLW 17 FOR INST (MISCELLANEOUS) ×2 IMPLANT
MORCELLATOR COLLECT CONTAINER (MISCELLANEOUS) ×2
MORCELLATOR OVERFLOW FILTER (FILTER) ×2
MORCELLATOR ROTATION 4.75 335 (MISCELLANEOUS) ×2 IMPLANT
PACK CYSTO AR (MISCELLANEOUS) ×2 IMPLANT
SET CYSTO W/LG BORE CLAMP LF (SET/KITS/TRAYS/PACK) ×2 IMPLANT
SET IRRIG Y TYPE TUR BLADDER L (SET/KITS/TRAYS/PACK) ×2 IMPLANT
SLEEVE PROTECTION STRL DISP (MISCELLANEOUS) ×4 IMPLANT
SURGILUBE 2OZ TUBE FLIPTOP (MISCELLANEOUS) ×2 IMPLANT
SYR TOOMEY IRRIG 70ML (MISCELLANEOUS) ×2
SYRINGE TOOMEY IRRIG 70ML (MISCELLANEOUS) ×2 IMPLANT
TUBE PUMP MORCELLATOR PIRANHA (TUBING) ×2 IMPLANT
WATER STERILE IRR 1000ML POUR (IV SOLUTION) ×2 IMPLANT
WATER STERILE IRR 500ML POUR (IV SOLUTION) ×2 IMPLANT

## 2023-02-18 NOTE — Anesthesia Postprocedure Evaluation (Signed)
Anesthesia Post Note  Patient: Ryan Beard  Procedure(s) Performed: HOLEP-LASER ENUCLEATION OF THE PROSTATE WITH MORCELLATION (Prostate) CYSTOSCOPY WITH LITHOLAPAXY (Bladder)  Patient location during evaluation: PACU Anesthesia Type: General Level of consciousness: awake and awake and alert Pain management: pain level controlled Vital Signs Assessment: post-procedure vital signs reviewed and stable Respiratory status: spontaneous breathing Cardiovascular status: stable Anesthetic complications: no   No notable events documented.   Last Vitals:  Vitals:   02/18/23 0915 02/18/23 0930  BP: 121/60 120/66  Pulse: 63 64  Resp: 10 14  Temp: (!) 36.1 C 36.6 C  SpO2: 99% 100%    Last Pain:  Vitals:   02/18/23 0930  TempSrc:   PainSc: 0-No pain                 VAN STAVEREN,Maxton Noreen

## 2023-02-18 NOTE — Anesthesia Procedure Notes (Signed)
Procedure Name: Intubation Date/Time: 02/18/2023 7:31 AM  Performed by: Mohammed Kindle, CRNAPre-anesthesia Checklist: Patient identified, Emergency Drugs available, Suction available and Patient being monitored Patient Re-evaluated:Patient Re-evaluated prior to induction Oxygen Delivery Method: Circle system utilized Preoxygenation: Pre-oxygenation with 100% oxygen Induction Type: IV induction Ventilation: Mask ventilation without difficulty Laryngoscope Size: McGraph and 3 Grade View: Grade I Tube type: Oral Tube size: 6.5 mm Number of attempts: 1 Airway Equipment and Method: Stylet Placement Confirmation: ETT inserted through vocal cords under direct vision, positive ETCO2, breath sounds checked- equal and bilateral and CO2 detector Secured at: 21 cm Tube secured with: Tape Dental Injury: Teeth and Oropharynx as per pre-operative assessment

## 2023-02-18 NOTE — Discharge Instructions (Signed)

## 2023-02-18 NOTE — Interval H&P Note (Signed)
UROLOGY H&P UPDATE  Agree with prior H&P dated 02/01/2023.  170 g prostate with recurrent gross hematuria felt to be related to BPH, obstructive symptoms.  Benign prostate MRI, history of negative prostate biopsy  Cardiac: RRR Lungs: CTA bilaterally  Laterality: N/A Procedure: HOLEP   Urine: culture no growth  We discussed the risks and benefits of HoLEP at length.  The procedure requires general anesthesia and takes 1 to 2 hours, and a holmium laser is used to enucleate the prostate and push this tissue into the bladder.  A morcellator is then used to remove this tissue, which is sent for pathology.  The vast majority(>95%) of patients are able to discharge the same day with a catheter in place for 2 to 3 days, and will follow-up in clinic for a voiding trial.  We specifically discussed the risks of bleeding, infection, retrograde ejaculation, temporary urgency and urge incontinence, very low risk of long-term incontinence, urethral stricture/bladder neck contracture, pathologic evaluation of prostate tissue and possible detection of prostate cancer or other malignancy, and possible need for additional procedures.   Sondra Come, MD 02/18/2023

## 2023-02-18 NOTE — Transfer of Care (Signed)
Immediate Anesthesia Transfer of Care Note  Patient: Ryan Beard  Procedure(s) Performed: HOLEP-LASER ENUCLEATION OF THE PROSTATE WITH MORCELLATION (Prostate) CYSTOSCOPY WITH LITHOLAPAXY (Bladder)  Patient Location: PACU  Anesthesia Type:General  Level of Consciousness: awake, drowsy, and patient cooperative  Airway & Oxygen Therapy: Patient Spontanous Breathing and Patient connected to face mask oxygen  Post-op Assessment: Report given to RN and Post -op Vital signs reviewed and stable  Post vital signs: Reviewed and stable  Last Vitals:  Vitals Value Taken Time  BP 126/70 02/18/23 0856  Temp 36.6 C 02/18/23 0856  Pulse 56 02/18/23 0859  Resp 14 02/18/23 0859  SpO2 100 % 02/18/23 0859  Vitals shown include unfiled device data.  Last Pain:  Vitals:   02/18/23 0621  TempSrc: Tympanic         Complications: No notable events documented.Pt required redirection noted rubbing eyes. [facility] CRNA

## 2023-02-18 NOTE — Op Note (Signed)
Date of procedure: 02/18/23  Preoperative diagnosis:  BPH with obstruction Bladder stones  Postoperative diagnosis:  Same  Procedure: HoLEP (Holmium Laser Enucleation of the Prostate) Evacuation of bladder stones  Surgeon: Legrand Rams, MD  Anesthesia: General  Complications: None  Intraoperative findings:  Uncomplicated HOLEP, good hemostasis Multiple small bladder stones evacuated from bladder.  EBL: Minimal  Specimens: Prostate chips  Enucleation time: 25 minutes  Morcellation time: 27 minutes  Intra-op weight: 123g  Drains: 24 French three-way, 60 cc in balloon  Indication: Ryan Beard is a 72 y.o. patient with BPH and obstructive urinary symptoms, recurrent gross hematuria who opted for HOLEP.  After reviewing the management options for treatment, they elected to proceed with the above surgical procedure(s). We have discussed the potential benefits and risks of the procedure, side effects of the proposed treatment, the likelihood of the patient achieving the goals of the procedure, and any potential problems that might occur during the procedure or recuperation.  We specifically discussed the risks of bleeding, infection, hematuria and clot retention, need for additional procedures, possible overnight hospital stay, temporary urgency and incontinence, rare long-term incontinence, and retrograde ejaculation.  Informed consent has been obtained.   Description of procedure:  The patient was taken to the operating room and general anesthesia was induced.  The patient was placed in the dorsal lithotomy position, prepped and draped in the usual sterile fashion, and preoperative antibiotics(Ancef) were administered.  SCDs were placed for DVT prophylaxis.  A preoperative time-out was performed.   Ryan Beard sounds were used to gently dilated the urethra up to 64F. The 51 French continuous flow resectoscope was inserted into the urethra using the visual obturator  The  prostate was large with obstructing lateral lobes and a very large median lobe and elevated bladder neck. The bladder was thoroughly inspected and notable for moderate trabeculations, unable to visualize stones behind the large median lobe.  Difficult to visualize the ureteral orifices with the large median lobe.  The laser was set to 2 J and 60 Hz and early apical release was performed by making a circumferential mucosal incision proximal to the sphincter.  A lambda incision was then made proximal to the verumontanum.  The prostate was enucleated en bloc circumferentially into the bladder.  The capsule was examined and laser was used for meticulous hemostasis.  The left ureteral orifice was intact, the right ureteral orifice appeared to be just adjacent to the bladder neck.    The 39 French resectoscope was then switched out for the 26 French nephroscope and prostate tissue was morcellated(Ryan Beard) and the tissue sent to pathology.  There were multiple small bladder stones and these were all evacuated through the scope.  A 24 French three-way catheter was inserted easily with the aid of a catheter guide, and 60 cc were placed in the balloon.  Urine was light pink.  The catheter irrigated easily with a Toomey syringe.  CBI was initiated.   The patient tolerated the procedure well without any immediate complications and was extubated and transferred to the recovery room in stable condition.  Urine was clear on fast CBI.  Disposition: Stable to PACU  Plan: Wean CBI in PACU, anticipate discharge home today with Foley removal in clinic in 2-3 days  Legrand Rams, MD 02/18/2023

## 2023-02-18 NOTE — Anesthesia Preprocedure Evaluation (Signed)
Anesthesia Evaluation  Patient identified by MRN, date of birth, ID band Patient awake    Reviewed: Allergy & Precautions, NPO status , Patient's Chart, lab work & pertinent test results  Airway Mallampati: II  TM Distance: >3 FB Neck ROM: full    Dental  (+) Teeth Intact   Pulmonary neg pulmonary ROS, Patient abstained from smoking., former smoker   Pulmonary exam normal breath sounds clear to auscultation       Cardiovascular Exercise Tolerance: Good hypertension, Pt. on medications + CAD  negative cardio ROS Normal cardiovascular exam Rhythm:Regular Rate:Normal     Neuro/Psych negative neurological ROS  negative psych ROS   GI/Hepatic negative GI ROS, Neg liver ROS,,,  Endo/Other  negative endocrine ROS    Renal/GU negative Renal ROS     Musculoskeletal negative musculoskeletal ROS (+)    Abdominal Normal abdominal exam  (+)   Peds negative pediatric ROS (+)  Hematology negative hematology ROS (+)   Anesthesia Other Findings Past Medical History: No date: Abnormal PSA No date: Hyperlipidemia No date: Hypertension  Past Surgical History: 1973: APPENDECTOMY No date: CARDIAC CATHETERIZATION No date: NOSE SURGERY No date: PROSTATE BIOPSY 1960: SEPTOPLASTY 1960: TONSILLECTOMY  BMI    Body Mass Index: 24.37 kg/m      Reproductive/Obstetrics negative OB ROS                             Anesthesia Physical Anesthesia Plan  ASA: 2  Anesthesia Plan: General   Post-op Pain Management:    Induction: Intravenous  PONV Risk Score and Plan: Ondansetron, Dexamethasone, Midazolam and Treatment may vary due to age or medical condition  Airway Management Planned: Oral ETT  Additional Equipment:   Intra-op Plan:   Post-operative Plan: Extubation in OR  Informed Consent: I have reviewed the patients History and Physical, chart, labs and discussed the procedure including the  risks, benefits and alternatives for the proposed anesthesia with the patient or authorized representative who has indicated his/her understanding and acceptance.     Dental Advisory Given  Plan Discussed with: CRNA and Surgeon  Anesthesia Plan Comments:        Anesthesia Quick Evaluation

## 2023-02-20 NOTE — Plan of Care (Signed)
[facility] Tonsillectomy/Adenoidectomy, Postoperative PEDS care plan entered in error.

## 2023-02-21 ENCOUNTER — Encounter: Payer: Self-pay | Admitting: Urology

## 2023-02-21 ENCOUNTER — Ambulatory Visit (INDEPENDENT_AMBULATORY_CARE_PROVIDER_SITE_OTHER): Payer: PPO | Admitting: Urology

## 2023-02-21 VITALS — BP 162/74 | HR 70

## 2023-02-21 DIAGNOSIS — N138 Other obstructive and reflux uropathy: Secondary | ICD-10-CM

## 2023-02-21 DIAGNOSIS — N401 Enlarged prostate with lower urinary tract symptoms: Secondary | ICD-10-CM

## 2023-02-21 LAB — SURGICAL PATHOLOGY

## 2023-02-21 NOTE — Patient Instructions (Signed)
Congratulations on your recent HOLEP procedure! As discussed in clinic today, these are the three normal postoperative findings after this surgery: Burning or pain with urination: This typically resolves within 1 week of surgery. If you are still having significant pain with urination 10 days after surgery, please call our clinic. We may need to check you for a urinary tract infection at that point, though this is rare. Blood in the urine: This may either come and go or steadily improve before going away, but typically resolves completely within 3 weeks of surgery. If you are on blood thinners, it may take longer for the bleeding to resolve. Please note that you may find that you pass clumps of tissue or debris around 10-14 days after surgery associated with some new bleeding. This is due to sloughing of your postoperative scab and is also normal. If at any point you start to pass dark red urine; thick, ketchup-like urine; or large blood clots around the size of your palm, please call our office immediately. Urinary leakage or urgency: This tends to improve with time, with most patients becoming dry within around 3 months of surgery. You may wear absorbant underwear or liners for security during this time. To help you get dry faster, please make sure you are completing your Kegel exercises as instructed, with a set of 10 exercises completed up to three times daily. Further information on Kegel exercises can be found in the back of this packet.  v

## 2023-02-21 NOTE — Progress Notes (Signed)
Catheter Removal  He underwent HoLEP procedure on February 18, 2023 with Dr. Richardo Hanks.  His postprocedural course was as expected and uneventful.  Prostate chips pathology still pending.  Patient is present today for a catheter removal.  60 ml of water was drained from the balloon. A 24 FR 3- way foley cath was removed from the bladder, no complications were noted. Patient tolerated well.  Performed by: Michiel Cowboy, PA-C   Follow up/ Additional notes: Return for as scheduled with Dr. Richardo Hanks .

## 2023-02-28 ENCOUNTER — Encounter (HOSPITAL_BASED_OUTPATIENT_CLINIC_OR_DEPARTMENT_OTHER): Payer: Self-pay | Admitting: Cardiology

## 2023-03-02 ENCOUNTER — Encounter: Payer: Self-pay | Admitting: Urology

## 2023-03-02 ENCOUNTER — Ambulatory Visit: Payer: PPO | Admitting: Urology

## 2023-03-02 VITALS — BP 164/72 | HR 80 | Ht 69.0 in | Wt 164.0 lb

## 2023-03-02 DIAGNOSIS — N401 Enlarged prostate with lower urinary tract symptoms: Secondary | ICD-10-CM

## 2023-03-02 DIAGNOSIS — N138 Other obstructive and reflux uropathy: Secondary | ICD-10-CM | POA: Diagnosis not present

## 2023-03-02 DIAGNOSIS — R8290 Unspecified abnormal findings in urine: Secondary | ICD-10-CM

## 2023-03-02 DIAGNOSIS — R310 Gross hematuria: Secondary | ICD-10-CM | POA: Diagnosis not present

## 2023-03-02 LAB — MICROSCOPIC EXAMINATION
Epithelial Cells (non renal): NONE SEEN /[HPF] (ref 0–10)
RBC, Urine: >30 /[HPF] — ABNORMAL HIGH (ref 0–2)
WBC, UA: >30 /[HPF] — ABNORMAL HIGH (ref 0–5)

## 2023-03-02 LAB — URINALYSIS, ROUTINE W REFLEX MICROSCOPIC
Bilirubin, UA: NEGATIVE
Glucose, UA: NEGATIVE
Nitrite, UA: NEGATIVE
Specific Gravity, UA: >1.03 — ABNORMAL HIGH (ref 1.005–1.030)
Urobilinogen, Ur: 0.2 mg/dL (ref 0.2–1.0)
pH, UA: 5.5 (ref 5.0–7.5)

## 2023-03-02 LAB — BLADDER SCAN AMB NON-IMAGING

## 2023-03-02 NOTE — Progress Notes (Signed)
Assessment: 1. BPH with obstruction/lower urinary tract symptoms   2. Abnormal urine findings     Plan: Urine culture sent today Return to office in 6 months  Chief Complaint:  Chief Complaint  Patient presents with   Benign Prostatic Hypertrophy    History of Present Illness:  Ryan Beard is a 72 y.o. male who is seen for further evaluation of gross hematuria, BPH with obstruction, and elevated PSA. He presented to the emergency room on 12/04/2022 with gross hematuria.  He also had some low back pain. His symptoms began after playing golf on 12/02/2022.  He initially noted some red urine which has changed to a brown color and has cleared today.  He did pass a few small clots.  No flank pain.  No dysuria.  He has noted some red-tinged urine intermittently after activity such as golf for approximately 1 year.  No prior evaluation with cystoscopy. CT imaging from 12/04/2022 showed an indeterminant 2.8 x 2.5 cm exophytic lesion arising from the interpolar region of the left kidney, no hydronephrosis, no renal calculi, nonspecific soft tissue density noted at the left UVJ with associated calcification.  He has a history of an elevated PSA.  He has previously been followed by Dr. Saddie Benders in [place].  He underwent a prostate biopsy in January 2018 which was benign.  Confirm MDX was negative.  He has been followed with periodic prostate MRIs.  Prostate MRI from 11/18 and 3/21 showed no evidence of high-grade prostate cancer.  Prostate volume from 3/21 was 74 cm. PSA from 04/14/2022: 17.4 PSA from 10/12/2022: 23.1 Prostate MRI from 12/07/2021 showed no suspicious lesions within the prostate, prostate volume of 170 cm, possible heaped mucosa/tumor in bladder.  He has a history of lower urinary tract symptoms including intermittent stream, urgency, decreased force of stream, and nocturia.  He was on tamsulosin. IPSS = 23.  He has a history of hypogonadism.  He is currently on Clomid.  He is  followed at [facility].  CT hematuria protocol from 12/22/2022 showed a very large prostate with mass effect on the bladder, bladder wall thickening, no obvious filling defects in either collecting system, small left renal stones, 2 cm cyst on the left kidney, and small bladder stone. Cystoscopy from 8/24 showed significantly enlarged prostate with a large median lobe and increased vascularity of the prostatic urethra with a normal-appearing bladder. He is status post HoLEP procedure on 02/18/2023.  He passed a voiding trial on 02/21/2023.  Path showed BPH with chronic inflammation.  He returns today for follow-up.  He is doing very well following the surgery.  He is voiding with an excellent stream.  He is having some slight dysuria.  No gross hematuria.  He feels like he is emptying his bladder well.  He is not currently on any antibiotics. IPSS = 3.  Portions of the above documentation were copied from a prior visit for review purposes only.  Past Medical History:  Past Medical History:  Diagnosis Date   Abnormal PSA    Hyperlipidemia    Hypertension     Past Surgical History:  Past Surgical History:  Procedure Laterality Date   APPENDECTOMY  1973   CARDIAC CATHETERIZATION     CYSTOSCOPY WITH LITHOLAPAXY N/A 02/18/2023   Procedure: CYSTOSCOPY WITH LITHOLAPAXY;  Surgeon: Sondra Come, MD;  Location: [facility] ORS;  Service: Urology;  Laterality: N/A;   HOLEP-LASER ENUCLEATION OF THE PROSTATE WITH MORCELLATION N/A 02/18/2023   Procedure: HOLEP-LASER ENUCLEATION OF THE  PROSTATE WITH MORCELLATION;  Surgeon: Sondra Come, MD;  Location: [facility] ORS;  Service: Urology;  Laterality: N/A;   NOSE SURGERY     PROSTATE BIOPSY     SEPTOPLASTY  1960   TONSILLECTOMY  1960    Allergies:  No Known Allergies  Family History:  Family History  Problem Relation Age of Onset   Heart disease Mother    Cancer Father        lymphoma   Hypertension Father    Alcohol abuse Sister    Colon cancer  Neg Hx    Esophageal cancer Neg Hx    Rectal cancer Neg Hx    Stomach cancer Neg Hx    Prostate cancer Neg Hx     Social History:  Social History   Tobacco Use   Smoking status: Former    Current packs/day: 0.00    Average packs/day: 1 pack/day for 30.0 years (30.0 ttl pk-yrs)    Types: Cigarettes    Start date: 05/04/1975    Quit date: 05/03/2005    Years since quitting: 17.8    Passive exposure: Never   Smokeless tobacco: Never  Vaping Use   Vaping status: Never Used  Substance Use Topics   Alcohol use: Yes    Alcohol/week: 1.0 standard drink of alcohol    Types: 1 Glasses of wine per week   Drug use: No    ROS: Constitutional:  Negative for fever, chills, weight loss CV: Negative for chest pain, previous MI, hypertension Respiratory:  Negative for shortness of breath, wheezing, sleep apnea, frequent cough GI:  Negative for nausea, vomiting, bloody stool, GERD  Physical exam: BP (!) 164/72   Pulse 80   Ht 5\' 9"  (1.753 m)   Wt 164 lb (74.4 kg)   BMI 24.22 kg/m  GENERAL APPEARANCE:  Well appearing, well developed, well nourished, NAD HEENT:  Atraumatic, normocephalic, oropharynx clear NECK:  Supple without lymphadenopathy or thyromegaly ABDOMEN:  Soft, non-tender, no masses EXTREMITIES:  Moves all extremities well, without clubbing, cyanosis, or edema NEUROLOGIC:  Alert and oriented x 3, normal gait, CN II-XII grossly intact MENTAL STATUS:  appropriate BACK:  Non-tender to palpation, No CVAT SKIN:  Warm, dry, and intact   Results: U/A:   >30 WBC, >30 RBC, many bacteria  PVR:  37 ml

## 2023-03-04 ENCOUNTER — Encounter: Payer: Self-pay | Admitting: Urology

## 2023-03-04 LAB — URINE CULTURE: Organism ID, Bacteria: NO GROWTH

## 2023-05-31 DIAGNOSIS — H2513 Age-related nuclear cataract, bilateral: Secondary | ICD-10-CM | POA: Diagnosis not present

## 2023-05-31 DIAGNOSIS — H43393 Other vitreous opacities, bilateral: Secondary | ICD-10-CM | POA: Diagnosis not present

## 2023-06-08 ENCOUNTER — Other Ambulatory Visit (HOSPITAL_BASED_OUTPATIENT_CLINIC_OR_DEPARTMENT_OTHER): Payer: Self-pay | Admitting: Cardiology

## 2023-06-09 ENCOUNTER — Encounter: Payer: Self-pay | Admitting: Family Medicine

## 2023-06-15 DIAGNOSIS — H52221 Regular astigmatism, right eye: Secondary | ICD-10-CM | POA: Diagnosis not present

## 2023-06-15 DIAGNOSIS — Z01818 Encounter for other preprocedural examination: Secondary | ICD-10-CM | POA: Diagnosis not present

## 2023-06-15 DIAGNOSIS — H25811 Combined forms of age-related cataract, right eye: Secondary | ICD-10-CM | POA: Diagnosis not present

## 2023-06-21 ENCOUNTER — Other Ambulatory Visit: Payer: Self-pay

## 2023-06-21 ENCOUNTER — Encounter: Payer: Self-pay | Admitting: Urology

## 2023-06-21 ENCOUNTER — Ambulatory Visit: Payer: PPO | Admitting: Urology

## 2023-06-21 VITALS — BP 149/64 | HR 81 | Ht 69.0 in | Wt 164.0 lb

## 2023-06-21 DIAGNOSIS — Z87438 Personal history of other diseases of male genital organs: Secondary | ICD-10-CM | POA: Diagnosis not present

## 2023-06-21 DIAGNOSIS — Z09 Encounter for follow-up examination after completed treatment for conditions other than malignant neoplasm: Secondary | ICD-10-CM

## 2023-06-21 DIAGNOSIS — N138 Other obstructive and reflux uropathy: Secondary | ICD-10-CM

## 2023-06-21 DIAGNOSIS — Z0000 Encounter for general adult medical examination without abnormal findings: Secondary | ICD-10-CM

## 2023-06-21 LAB — BLADDER SCAN AMB NON-IMAGING: Scan Result: 28

## 2023-06-21 NOTE — Progress Notes (Signed)
   06/21/2023 11:17 AM   Ryan Beard Jul 23, 1950 [id number]  Reason for visit: Follow up BPH s/p HoLEP  HPI: Originally referred by Dr. Pete Glatter for consideration of HOLEP for BPH and obstructive urinary symptoms as well as recurrent gross hematuria.  He underwent an uncomplicated HOLEP on 02/18/2023 with removal of 114 g of benign tissue showing chronic prostatitis but no evidence of malignancy.  He has been doing extremely well since that time.  He denies any complaints today.  Voiding with a strong stream, PVR today normal at 28ml.  Had some postop dysuria for a few weeks that has since resolved, he denies any incontinence.  Reassurance provided regarding benign pathology results.  Follow-up with me as needed, has routine follow-up with Dr. Pete Glatter April 2025  Sondra Come, MD  [facility] [address], [address] [place], [place] [address] [phone]

## 2023-06-29 ENCOUNTER — Other Ambulatory Visit: Payer: PPO

## 2023-07-01 DIAGNOSIS — H2511 Age-related nuclear cataract, right eye: Secondary | ICD-10-CM | POA: Diagnosis not present

## 2023-07-04 ENCOUNTER — Telehealth: Payer: Self-pay | Admitting: *Deleted

## 2023-07-04 DIAGNOSIS — E559 Vitamin D deficiency, unspecified: Secondary | ICD-10-CM

## 2023-07-04 DIAGNOSIS — E291 Testicular hypofunction: Secondary | ICD-10-CM

## 2023-07-04 NOTE — Telephone Encounter (Signed)
 It looks like he is scheduled for his annual physical with you on 07/13/2023.  Routing this to you to see if you are comfortable ordering these for him.  He is established with urology for history of elevated PSA labs and has had extensive workup with them, his next appointment with urology is scheduled for the end of April so not sure if you want to defer PSA and testosterone to them or not.

## 2023-07-04 NOTE — Telephone Encounter (Signed)
 Copied from CRM [id number]. Topic: Appointments - Scheduling Inquiry for Clinic >> Jul 04, 2023  3:59 PM Dondra Prader E wrote: Reason for CRM: Pt wants to have his testosterone, vitamin D, and PSA labs.   Best contact: [phone]

## 2023-07-05 ENCOUNTER — Other Ambulatory Visit: Payer: PPO

## 2023-07-05 DIAGNOSIS — Z0000 Encounter for general adult medical examination without abnormal findings: Secondary | ICD-10-CM | POA: Diagnosis not present

## 2023-07-05 DIAGNOSIS — I10 Essential (primary) hypertension: Secondary | ICD-10-CM

## 2023-07-05 DIAGNOSIS — E7800 Pure hypercholesterolemia, unspecified: Secondary | ICD-10-CM | POA: Diagnosis not present

## 2023-07-05 NOTE — Telephone Encounter (Signed)
 Can you call the patient and let him know we can order the vitamin d and testosterone labs but it's probably better to wait until he sees the urologist to determine when he should get his psa tested. He just had a procedure on his prostate and they may want to wait until after a certain period of time before they recheck it.

## 2023-07-06 ENCOUNTER — Encounter: Payer: PPO | Admitting: Family Medicine

## 2023-07-06 LAB — COMPREHENSIVE METABOLIC PANEL
ALT: 22 IU/L (ref 0–44)
AST: 17 IU/L (ref 0–40)
Albumin: 4.5 g/dL (ref 3.9–4.9)
Alkaline Phosphatase: 50 IU/L (ref 44–121)
BUN/Creatinine Ratio: 14 (ref 10–24)
BUN: 15 mg/dL (ref 8–27)
Bilirubin Total: 0.3 mg/dL (ref 0.0–1.2)
CO2: 20 mmol/L (ref 20–29)
Calcium: 9.5 mg/dL (ref 8.6–10.2)
Chloride: 104 mmol/L (ref 96–106)
Creatinine, Ser: 1.04 mg/dL (ref 0.76–1.27)
Globulin, Total: 2.1 g/dL (ref 1.5–4.5)
Glucose: 85 mg/dL (ref 70–99)
Potassium: 4.5 mmol/L (ref 3.5–5.2)
Sodium: 138 mmol/L (ref 134–144)
Total Protein: 6.6 g/dL (ref 6.0–8.5)
eGFR: 77 mL/min/{1.73_m2} (ref >59)

## 2023-07-06 LAB — CBC WITH DIFFERENTIAL/PLATELET
Basophils Absolute: 0 10*3/uL (ref 0.0–0.2)
Basos: 1 %
EOS (ABSOLUTE): 0.3 10*3/uL (ref 0.0–0.4)
Eos: 4 %
Hematocrit: 41.4 % (ref 37.5–51.0)
Hemoglobin: 14 g/dL (ref 13.0–17.7)
Immature Grans (Abs): 0 10*3/uL (ref 0.0–0.1)
Immature Granulocytes: 0 %
Lymphocytes Absolute: 2.2 10*3/uL (ref 0.7–3.1)
Lymphs: 29 %
MCH: 27.8 pg (ref 26.6–33.0)
MCHC: 33.8 g/dL (ref 31.5–35.7)
MCV: 82 fL (ref 79–97)
Monocytes Absolute: 0.6 10*3/uL (ref 0.1–0.9)
Monocytes: 8 %
Neutrophils Absolute: 4.4 10*3/uL (ref 1.4–7.0)
Neutrophils: 58 %
Platelets: 184 10*3/uL (ref 150–450)
RBC: 5.03 x10E6/uL (ref 4.14–5.80)
RDW: 15.8 % — ABNORMAL HIGH (ref 11.6–15.4)
WBC: 7.5 10*3/uL (ref 3.4–10.8)

## 2023-07-06 LAB — TSH: TSH: 2.48 u[IU]/mL (ref 0.450–4.500)

## 2023-07-06 LAB — HEMOGLOBIN A1C
Est. average glucose Bld gHb Est-mCnc: 117 mg/dL
Hgb A1c MFr Bld: 5.7 % — ABNORMAL HIGH (ref 4.8–5.6)

## 2023-07-06 NOTE — Telephone Encounter (Signed)
 Called pt he was agreeable to the recommendation he has an appointment to see the urologist in April

## 2023-07-06 NOTE — Addendum Note (Signed)
 Addended by: Sandre Kitty on: 07/06/2023 09:46 AM   Modules accepted: Orders

## 2023-07-13 ENCOUNTER — Encounter: Payer: Self-pay | Admitting: Family Medicine

## 2023-07-13 ENCOUNTER — Ambulatory Visit (INDEPENDENT_AMBULATORY_CARE_PROVIDER_SITE_OTHER): Payer: PPO | Admitting: Family Medicine

## 2023-07-13 VITALS — BP 149/80 | HR 92 | Ht 69.0 in | Wt 168.1 lb

## 2023-07-13 DIAGNOSIS — N401 Enlarged prostate with lower urinary tract symptoms: Secondary | ICD-10-CM | POA: Diagnosis not present

## 2023-07-13 DIAGNOSIS — I10 Essential (primary) hypertension: Secondary | ICD-10-CM | POA: Diagnosis not present

## 2023-07-13 DIAGNOSIS — E7800 Pure hypercholesterolemia, unspecified: Secondary | ICD-10-CM

## 2023-07-13 DIAGNOSIS — N138 Other obstructive and reflux uropathy: Secondary | ICD-10-CM | POA: Diagnosis not present

## 2023-07-13 DIAGNOSIS — E291 Testicular hypofunction: Secondary | ICD-10-CM | POA: Diagnosis not present

## 2023-07-13 DIAGNOSIS — Z0000 Encounter for general adult medical examination without abnormal findings: Secondary | ICD-10-CM

## 2023-07-13 NOTE — Progress Notes (Signed)
   Annual physical  Subjective   Patient ID: Ryan Beard, male    DOB: [birth year]  Age: 73 y.o. MRN: [id number]  Chief Complaint  Patient presents with   Annual Exam   HPI Ryan Beard is a 73 y.o. old male here  for annual exam.   Work:stock trader.  Previously worked in [employer] Relationship:girlfriend.  Children:no Tobacco:former - quit at age 51.  30 years pack history.   Alcohol:[place] wine.   Recreational drugs:no  Diet:regular diet.   Exercise:p90x.    Family history of prostate or colorectal cancer:no  Colonoscopy -patient Cologuard in 2020.  Prior to that had 1 colonoscopy.  Wants to hold off on repeat Cologuard for now, may be considered next year.  Other providers: Cardiology, urology, ophthalmology  Takes lisinopril 10 mg and rosuvastatin 20 mg.  Recently had HoLEP surgery for enlarged prostate last October.  Patient has occasional left gluteal pain when he lies down for too long that resolves quickly after standing.  Patient takes clomiphene that he gets locally from a company called [employer].  He takes 20 mg every day.  It is from a Set designer in [place].  Previously took testosterone gel      The 10-year ASCVD risk score (Arnett DK, et al., 2019) is: 25.3%  Health Maintenance Due  Topic Date Due   COVID-19 Vaccine (1) Never done   Pneumonia Vaccine 78+ Years old (1 of 2 - PCV) Never done   Zoster Vaccines- Shingrix (1 of 2) Never done   Fecal DNA (Cologuard)  01/09/2022      Objective:     BP (!) 149/80   Pulse 92   Ht 5\' 9"  (1.753 m)   Wt 168 lb 1.9 oz (76.3 kg)   SpO2 97%   BMI 24.83 kg/m    Physical Exam General: Alert, oriented.  Appears younger than stated age HEENT: PERRLA, EOMI, moist mucosa CV: Regular rate and rhythm Pulmonary: Lungs clear bilaterally GI: Soft, normal bowel sounds MSK: Non tender to palpation in the left gluteal region.  No tenderness to palpation over the greater trochanter.  Psych: Pleasant  affect.   No results found for any visits on 07/13/23.      Assessment & Plan:   Physical exam, annual  Essential hypertension Assessment & Plan: Elevated today.  Patient states it is better at home.  On lisinopril only currently.  Provided him with blood pressure log and advised him to check it twice a day for the next 2 weeks and bring it back to [place] before we make any changes to his medication.   Pure hypercholesterolemia Assessment & Plan: LDL 5 months ago was at goal.  Will recheck again at his next physical.  Continue rosuvastatin.   Hypogonadism in male Assessment & Plan: Takes clomiphene every other day 20 mg that he received from [facility] MD in [place].  Recheck testosterone at next visit.   BPH with obstruction/lower urinary tract symptoms Assessment & Plan: Improved significantly since having HoLEP procedure.      Return in about 1 year (around 07/12/2024) for physical.    Sandre Kitty, MD

## 2023-07-13 NOTE — Assessment & Plan Note (Signed)
 LDL 5 months ago was at goal.  Will recheck again at his next physical.  Continue rosuvastatin.

## 2023-07-13 NOTE — Assessment & Plan Note (Signed)
 Elevated today.  Patient states it is better at home.  On lisinopril only currently.  Provided him with blood pressure log and advised him to check it twice a day for the next 2 weeks and bring it back to [place] before we make any changes to his medication.

## 2023-07-13 NOTE — Patient Instructions (Signed)
 It was nice to see you today,  We addressed the following topics today: -I would like you to check your blood pressure twice a day morning and evening for the next 2 weeks and bring back the results to [place].  If they are elevated I will adjust your blood pressure medication.  Otherwise we will have you come back in a year for your physical.  Have a great day,  Frederic Jericho, MD

## 2023-07-13 NOTE — Assessment & Plan Note (Signed)
 Takes clomiphene every other day 20 mg that he received from [facility] MD in [place].  Recheck testosterone at next visit.

## 2023-07-13 NOTE — Assessment & Plan Note (Signed)
 Improved significantly since having HoLEP procedure.

## 2023-07-15 DIAGNOSIS — I10 Essential (primary) hypertension: Secondary | ICD-10-CM | POA: Diagnosis not present

## 2023-07-15 DIAGNOSIS — H25812 Combined forms of age-related cataract, left eye: Secondary | ICD-10-CM | POA: Diagnosis not present

## 2023-07-15 DIAGNOSIS — H2512 Age-related nuclear cataract, left eye: Secondary | ICD-10-CM | POA: Diagnosis not present

## 2023-07-26 DIAGNOSIS — E291 Testicular hypofunction: Secondary | ICD-10-CM | POA: Diagnosis not present

## 2023-07-26 DIAGNOSIS — R972 Elevated prostate specific antigen [PSA]: Secondary | ICD-10-CM | POA: Diagnosis not present

## 2023-07-26 DIAGNOSIS — Z1329 Encounter for screening for other suspected endocrine disorder: Secondary | ICD-10-CM | POA: Diagnosis not present

## 2023-08-01 DIAGNOSIS — M2550 Pain in unspecified joint: Secondary | ICD-10-CM | POA: Diagnosis not present

## 2023-08-01 DIAGNOSIS — E291 Testicular hypofunction: Secondary | ICD-10-CM | POA: Diagnosis not present

## 2023-08-01 DIAGNOSIS — R6882 Decreased libido: Secondary | ICD-10-CM | POA: Diagnosis not present

## 2023-08-31 ENCOUNTER — Ambulatory Visit: Payer: PPO | Admitting: Urology

## 2023-09-01 ENCOUNTER — Ambulatory Visit: Admitting: Urology

## 2023-09-01 ENCOUNTER — Encounter: Payer: Self-pay | Admitting: Urology

## 2023-09-01 VITALS — BP 165/80 | HR 81 | Ht 69.0 in | Wt 167.0 lb

## 2023-09-01 DIAGNOSIS — Z87898 Personal history of other specified conditions: Secondary | ICD-10-CM | POA: Diagnosis not present

## 2023-09-01 DIAGNOSIS — N401 Enlarged prostate with lower urinary tract symptoms: Secondary | ICD-10-CM

## 2023-09-01 DIAGNOSIS — N138 Other obstructive and reflux uropathy: Secondary | ICD-10-CM

## 2023-09-01 DIAGNOSIS — R972 Elevated prostate specific antigen [PSA]: Secondary | ICD-10-CM | POA: Diagnosis not present

## 2023-09-01 LAB — URINALYSIS, ROUTINE W REFLEX MICROSCOPIC
Bilirubin, UA: NEGATIVE
Glucose, UA: NEGATIVE
Leukocytes,UA: NEGATIVE
Nitrite, UA: NEGATIVE
Protein,UA: NEGATIVE
RBC, UA: NEGATIVE
Specific Gravity, UA: 1.02 (ref 1.005–1.030)
Urobilinogen, Ur: 0.2 mg/dL (ref 0.2–1.0)
pH, UA: 6.5 (ref 5.0–7.5)

## 2023-09-01 LAB — MICROSCOPIC EXAMINATION: Bacteria, UA: NONE SEEN

## 2023-09-01 MED ORDER — TADALAFIL 5 MG PO TABS: 5.0000 mg | ORAL_TABLET | Freq: Every day | ORAL | 3 refills | Status: AC

## 2023-09-01 NOTE — Progress Notes (Signed)
 Assessment: 1. BPH with obstruction/lower urinary tract symptoms   2. History of elevated PSA     Plan: He will forward a copy of his recent PSA results to the office. Continue tadalafil  5 mg daily.  Refill sent. Return to office in 1 year.  Chief Complaint:  Chief Complaint  Patient presents with   Benign Prostatic Hypertrophy    History of Present Illness:  Ryan Beard is a 73 y.o. male who is seen for further evaluation of gross hematuria, BPH with obstruction, and elevated PSA. He presented to the emergency room on 12/04/2022 with gross hematuria.  He also had some low back pain. His symptoms began after playing golf on 12/02/2022.  He initially noted some red urine which has changed to a brown color and has cleared today.  He did pass a few small clots.  No flank pain.  No dysuria.  He has noted some red-tinged urine intermittently after activity such as golf for approximately 1 year.  No prior evaluation with cystoscopy. CT imaging from 12/04/2022 showed an indeterminant 2.8 x 2.5 cm exophytic lesion arising from the interpolar region of the left kidney, no hydronephrosis, no renal calculi, nonspecific soft tissue density noted at the left UVJ with associated calcification.  He has a history of an elevated PSA.  He has previously been followed by Dr. Reginal Capra in [place].  He underwent a prostate biopsy in January 2018 which was benign.  Confirm MDX was negative.  He has been followed with periodic prostate MRIs.  Prostate MRI from 11/18 and 3/21 showed no evidence of high-grade prostate cancer.  Prostate volume from 3/21 was 74 cm. PSA from 04/14/2022: 17.4 PSA from 10/12/2022: 23.1 Prostate MRI from 12/07/2021 showed no suspicious lesions within the prostate, prostate volume of 170 cm, possible heaped mucosa/tumor in bladder.  He had a history of lower urinary tract symptoms including intermittent stream, urgency, decreased force of stream, and nocturia.  He was on tamsulosin . IPSS  = 23.  He has a history of hypogonadism.  He is currently on Clomid.  He is followed at [facility].  CT hematuria protocol from 12/22/2022 showed a very large prostate with mass effect on the bladder, bladder wall thickening, no obvious filling defects in either collecting system, small left renal stones, 2 cm cyst on the left kidney, and small bladder stone. Cystoscopy from 8/24 showed significantly enlarged prostate with a large median lobe and increased vascularity of the prostatic urethra with a normal-appearing bladder. He is status post HoLEP procedure on 02/18/2023.  He passed a voiding trial on 02/21/2023.  Path showed BPH with chronic inflammation.  He returns today for follow-up.  He continues to do very well from a urinary standpoint since his procedure in October.  He voids with a great stream.  No dysuria or gross hematuria. IPSS = 3/0. He had a recent PSA done at [facility] which by report was 0.8. He continues on tadalafil  5 mg daily.  Portions of the above documentation were copied from a prior visit for review purposes only.  Past Medical History:  Past Medical History:  Diagnosis Date   Abnormal PSA    Cataract    Will hsve both eyes done by 07/15/2023   Hyperlipidemia    Hypertension     Past Surgical History:  Past Surgical History:  Procedure Laterality Date   APPENDECTOMY  1973   CARDIAC CATHETERIZATION     CYSTOSCOPY WITH LITHOLAPAXY N/A 02/18/2023   Procedure: CYSTOSCOPY WITH  LITHOLAPAXY;  Surgeon: Lawerence Pressman, MD;  Location: [facility] ORS;  Service: Urology;  Laterality: N/A;   EYE SURGERY  07/15/2023   Cataract surgery   HOLEP-LASER ENUCLEATION OF THE PROSTATE WITH MORCELLATION N/A 02/18/2023   Procedure: HOLEP-LASER ENUCLEATION OF THE PROSTATE WITH MORCELLATION;  Surgeon: Lawerence Pressman, MD;  Location: [facility] ORS;  Service: Urology;  Laterality: N/A;   NOSE SURGERY     PROSTATE BIOPSY     SEPTOPLASTY  1960   TONSILLECTOMY  1960    Allergies:  No Known  Allergies  Family History:  Family History  Problem Relation Age of Onset   Heart disease Mother    Cancer Father        lymphoma   Hypertension Father    Alcohol abuse Sister    Colon cancer Neg Hx    Esophageal cancer Neg Hx    Rectal cancer Neg Hx    Stomach cancer Neg Hx    Prostate cancer Neg Hx     Social History:  Social History   Tobacco Use   Smoking status: Former    Current packs/day: 0.00    Average packs/day: 1 pack/day for 30.0 years (30.0 ttl pk-yrs)    Types: Cigarettes    Start date: 05/04/1975    Quit date: 05/03/2005    Years since quitting: 18.3    Passive exposure: Never   Smokeless tobacco: Never  Vaping Use   Vaping status: Never Used  Substance Use Topics   Alcohol use: Yes    Alcohol/week: 1.0 standard drink of alcohol    Types: 1 Glasses of wine per week   Drug use: No    ROS: Constitutional:  Negative for fever, chills, weight loss CV: Negative for chest pain, previous MI, hypertension Respiratory:  Negative for shortness of breath, wheezing, sleep apnea, frequent cough GI:  Negative for nausea, vomiting, bloody stool, GERD  Physical exam: BP (!) 165/80   Pulse 81   Ht 5\' 9"  (1.753 m)   Wt 167 lb (75.8 kg)   BMI 24.66 kg/m  GENERAL APPEARANCE:  Well appearing, well developed, well nourished, NAD HEENT:  Atraumatic, normocephalic, oropharynx clear NECK:  Supple without lymphadenopathy or thyromegaly ABDOMEN:  Soft, non-tender, no masses EXTREMITIES:  Moves all extremities well, without clubbing, cyanosis, or edema NEUROLOGIC:  Alert and oriented x 3, normal gait, CN II-XII grossly intact MENTAL STATUS:  appropriate BACK:  Non-tender to palpation, No CVAT SKIN:  Warm, dry, and intact   Results: U/A: 0-5 WBCs, 0-2 RBCs

## 2024-01-30 ENCOUNTER — Other Ambulatory Visit (HOSPITAL_BASED_OUTPATIENT_CLINIC_OR_DEPARTMENT_OTHER): Payer: Self-pay | Admitting: Cardiology

## 2024-01-30 DIAGNOSIS — I10 Essential (primary) hypertension: Secondary | ICD-10-CM

## 2024-03-01 NOTE — Progress Notes (Signed)
 Ryan Beard                                          MRN: [id number]   03/01/2024   The [facility] Quality Team Specialist reviewed this patient medical record for the purposes of chart review for care gap closure. The following were reviewed: chart review for care gap closure-controlling blood pressure.    [facility] Quality Team

## 2024-05-10 NOTE — Progress Notes (Signed)
 TEDFORD BERG                                          MRN: [id number]   05/10/2024   The [facility] Quality Team Specialist reviewed this patient medical record for the purposes of chart review for care gap closure. The following were reviewed: chart review for care gap closure-controlling blood pressure.    [facility] Quality Team

## 2024-07-10 ENCOUNTER — Other Ambulatory Visit

## 2024-07-17 ENCOUNTER — Encounter: Admitting: Family Medicine

## 2024-08-06 ENCOUNTER — Ambulatory Visit: Admitting: Urology

## 2024-08-08 ENCOUNTER — Ambulatory Visit: Admitting: Urology
